# Patient Record
Sex: Female | Born: 1985 | Race: White | Hispanic: No | Marital: Married | Smoking: Never smoker
Health system: Southern US, Community
[De-identification: ages and names within clinical notes are randomized; demographics above are authoritative.]

## PROBLEM LIST (undated history)

## (undated) ENCOUNTER — Inpatient Hospital Stay (HOSPITAL_COMMUNITY): Payer: Self-pay

## (undated) DIAGNOSIS — J302 Other seasonal allergic rhinitis: Secondary | ICD-10-CM

## (undated) HISTORY — PX: OTHER SURGICAL HISTORY: SHX169

## (undated) HISTORY — PX: WISDOM TOOTH EXTRACTION: SHX21

---
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
## 2009-01-23 ENCOUNTER — Inpatient Hospital Stay (HOSPITAL_COMMUNITY): Admission: RE | Admit: 2009-01-23 | Discharge: 2009-01-25 | Payer: Self-pay | Admitting: Obstetrics & Gynecology

## 2010-03-20 LAB — CBC
Hemoglobin: 11.4 g/dL — ABNORMAL LOW (ref 12.0–15.0)
MCHC: 33.4 g/dL (ref 30.0–36.0)
MCV: 84.4 fL (ref 78.0–100.0)
Platelets: 218 10*3/uL (ref 150–400)
Platelets: 226 10*3/uL (ref 150–400)
RBC: 3.74 MIL/uL — ABNORMAL LOW (ref 3.87–5.11)
RDW: 12.6 % (ref 11.5–15.5)
WBC: 20.1 10*3/uL — ABNORMAL HIGH (ref 4.0–10.5)

## 2010-03-20 LAB — RPR: RPR Ser Ql: NONREACTIVE

## 2013-01-01 NOTE — L&D Delivery Note (Signed)
Delivery Note At 5:42 PM a viable and healthy female was delivered via Vaginal, Spontaneous Delivery (Presentation: ; Occiput Anterior).  APGAR:8, 9; weight pending .   Placenta status: Intact, Manual.  Cord: 3 vessels with the following complications: None.  Cord pH: na  Anesthesia: Epidural  Episiotomy: None Lacerations: 1st degree;Perineal Suture Repair: 3.0 vicryl rapide Est. Blood Loss (mL):  300 Slight increase in bleeding Post delivery. No cervical lacerations. No extensions. Manual removal of retained placental fragment without complication. Bleeding improved.  Mom to postpartum.  Baby to Couplet care / Skin to Skin.  Nancy Sullivan J 11/03/2013, 5:56 PM

## 2013-03-27 LAB — OB RESULTS CONSOLE HIV ANTIBODY (ROUTINE TESTING): HIV: NONREACTIVE

## 2013-03-27 LAB — OB RESULTS CONSOLE ANTIBODY SCREEN: Antibody Screen: NEGATIVE

## 2013-03-27 LAB — OB RESULTS CONSOLE ABO/RH: RH TYPE: POSITIVE

## 2013-03-27 LAB — OB RESULTS CONSOLE HEPATITIS B SURFACE ANTIGEN: HEP B S AG: NEGATIVE

## 2013-03-27 LAB — OB RESULTS CONSOLE RUBELLA ANTIBODY, IGM: RUBELLA: IMMUNE

## 2013-03-27 LAB — OB RESULTS CONSOLE RPR: RPR: NONREACTIVE

## 2013-04-13 LAB — OB RESULTS CONSOLE GC/CHLAMYDIA
Chlamydia: NEGATIVE
Gonorrhea: NEGATIVE

## 2013-08-10 LAB — OB RESULTS CONSOLE RPR: RPR: NONREACTIVE

## 2013-10-06 LAB — OB RESULTS CONSOLE GBS: GBS: POSITIVE

## 2013-10-25 ENCOUNTER — Inpatient Hospital Stay (HOSPITAL_COMMUNITY)
Admission: AD | Admit: 2013-10-25 | Discharge: 2013-10-25 | Disposition: A | Discharge status: Home / Self Care | Payer: BC Managed Care – PPO | Source: Ambulatory Visit | Attending: Obstetrics | Admitting: Obstetrics

## 2013-10-25 ENCOUNTER — Encounter (HOSPITAL_COMMUNITY): Payer: Self-pay

## 2013-10-25 DIAGNOSIS — Z3A38 38 weeks gestation of pregnancy: Secondary | ICD-10-CM | POA: Insufficient documentation

## 2013-10-25 DIAGNOSIS — O471 False labor at or after 37 completed weeks of gestation: Secondary | ICD-10-CM | POA: Insufficient documentation

## 2013-10-25 NOTE — Discharge Instructions (Signed)

## 2013-10-25 NOTE — MAU Note (Signed)
Patient states she called the doctor's office and they said she should come in and be checked.

## 2013-11-02 ENCOUNTER — Encounter (HOSPITAL_COMMUNITY): Payer: Self-pay

## 2013-11-02 ENCOUNTER — Other Ambulatory Visit: Payer: Self-pay | Admitting: Obstetrics and Gynecology

## 2013-11-03 ENCOUNTER — Inpatient Hospital Stay (HOSPITAL_COMMUNITY): Payer: BC Managed Care – PPO | Admitting: Anesthesiology

## 2013-11-03 ENCOUNTER — Inpatient Hospital Stay (HOSPITAL_COMMUNITY)
Admission: RE | Admit: 2013-11-03 | Discharge: 2013-11-05 | DRG: 767 | Disposition: A | Discharge status: Home / Self Care | Payer: BC Managed Care – PPO | Source: Ambulatory Visit | Attending: Obstetrics and Gynecology | Admitting: Obstetrics and Gynecology

## 2013-11-03 ENCOUNTER — Encounter (HOSPITAL_COMMUNITY): Payer: Self-pay

## 2013-11-03 DIAGNOSIS — Z3A39 39 weeks gestation of pregnancy: Secondary | ICD-10-CM | POA: Diagnosis present

## 2013-11-03 DIAGNOSIS — O99824 Streptococcus B carrier state complicating childbirth: Principal | ICD-10-CM | POA: Diagnosis present

## 2013-11-03 DIAGNOSIS — O99820 Streptococcus B carrier state complicating pregnancy: Secondary | ICD-10-CM | POA: Diagnosis present

## 2013-11-03 LAB — CBC
HCT: 36.6 % (ref 36.0–46.0)
Hemoglobin: 12.4 g/dL (ref 12.0–15.0)
MCH: 28.6 pg (ref 26.0–34.0)
MCHC: 33.9 g/dL (ref 30.0–36.0)
MCV: 84.3 fL (ref 78.0–100.0)
PLATELETS: 176 10*3/uL (ref 150–400)
RBC: 4.34 MIL/uL (ref 3.87–5.11)
RDW: 14.6 % (ref 11.5–15.5)
WBC: 9.1 10*3/uL (ref 4.0–10.5)

## 2013-11-03 LAB — TYPE AND SCREEN
ABO/RH(D): B POS
Antibody Screen: NEGATIVE

## 2013-11-03 LAB — ABO/RH: ABO/RH(D): B POS

## 2013-11-03 LAB — RPR

## 2013-11-03 MED ORDER — OXYTOCIN 40 UNITS IN LACTATED RINGERS INFUSION - SIMPLE MED: 62.5000 mL/h | INTRAVENOUS | Status: DC

## 2013-11-03 MED ORDER — LACTATED RINGERS IV SOLN: 500.0000 mL | INTRAVENOUS | Status: DC | PRN

## 2013-11-03 MED ORDER — ONDANSETRON HCL 4 MG/2ML IJ SOLN
4.0000 mg | Freq: Four times a day (QID) | INTRAMUSCULAR | Status: DC | PRN
  Administered 2013-11-03: 4 mg via INTRAVENOUS
  Filled 2013-11-03: qty 2

## 2013-11-03 MED ORDER — OXYCODONE-ACETAMINOPHEN 5-325 MG PO TABS: 1.0000 | ORAL_TABLET | ORAL | Status: DC | PRN

## 2013-11-03 MED ORDER — DEXTROSE 5 % IV SOLN
5.0000 10*6.[IU] | Freq: Once | INTRAVENOUS | Status: AC
  Administered 2013-11-03: 5 10*6.[IU] via INTRAVENOUS
  Filled 2013-11-03: qty 5

## 2013-11-03 MED ORDER — EPHEDRINE 5 MG/ML INJ
10.0000 mg | INTRAVENOUS | Status: DC | PRN
  Filled 2013-11-03: qty 2

## 2013-11-03 MED ORDER — IBUPROFEN 600 MG PO TABS
600.0000 mg | ORAL_TABLET | Freq: Four times a day (QID) | ORAL | Status: DC
  Administered 2013-11-04 – 2013-11-05 (×4): 600 mg via ORAL
  Filled 2013-11-03 (×4): qty 1

## 2013-11-03 MED ORDER — PENICILLIN G POTASSIUM 5000000 UNITS IJ SOLR
2.5000 10*6.[IU] | INTRAVENOUS | Status: DC
  Administered 2013-11-03: 2.5 10*6.[IU] via INTRAVENOUS
  Filled 2013-11-03 (×5): qty 2.5

## 2013-11-03 MED ORDER — TETANUS-DIPHTH-ACELL PERTUSSIS 5-2.5-18.5 LF-MCG/0.5 IM SUSP
0.5000 mL | Freq: Once | INTRAMUSCULAR | Status: DC
Start: 2013-11-04 — End: 2013-11-05

## 2013-11-03 MED ORDER — WITCH HAZEL-GLYCERIN EX PADS: 1.0000 "application " | MEDICATED_PAD | CUTANEOUS | Status: DC | PRN

## 2013-11-03 MED ORDER — LACTATED RINGERS IV SOLN: 500.0000 mL | Freq: Once | INTRAVENOUS | Status: DC

## 2013-11-03 MED ORDER — DIBUCAINE 1 % RE OINT: 1.0000 "application " | TOPICAL_OINTMENT | RECTAL | Status: DC | PRN

## 2013-11-03 MED ORDER — ZOLPIDEM TARTRATE 5 MG PO TABS: 5.0000 mg | ORAL_TABLET | Freq: Every evening | ORAL | Status: DC | PRN

## 2013-11-03 MED ORDER — PRENATAL MULTIVITAMIN CH
1.0000 | ORAL_TABLET | Freq: Every day | ORAL | Status: DC
  Administered 2013-11-04: 1 via ORAL
  Filled 2013-11-03: qty 1

## 2013-11-03 MED ORDER — LANOLIN HYDROUS EX OINT: TOPICAL_OINTMENT | CUTANEOUS | Status: DC | PRN

## 2013-11-03 MED ORDER — OXYCODONE-ACETAMINOPHEN 5-325 MG PO TABS: 2.0000 | ORAL_TABLET | ORAL | Status: DC | PRN

## 2013-11-03 MED ORDER — ACETAMINOPHEN 325 MG PO TABS: 650.0000 mg | ORAL_TABLET | ORAL | Status: DC | PRN

## 2013-11-03 MED ORDER — FLEET ENEMA 7-19 GM/118ML RE ENEM: 1.0000 | ENEMA | RECTAL | Status: DC | PRN

## 2013-11-03 MED ORDER — ONDANSETRON HCL 4 MG PO TABS: 4.0000 mg | ORAL_TABLET | ORAL | Status: DC | PRN

## 2013-11-03 MED ORDER — DIPHENHYDRAMINE HCL 25 MG PO CAPS: 25.0000 mg | ORAL_CAPSULE | Freq: Four times a day (QID) | ORAL | Status: DC | PRN

## 2013-11-03 MED ORDER — BENZOCAINE-MENTHOL 20-0.5 % EX AERO
1.0000 "application " | INHALATION_SPRAY | CUTANEOUS | Status: DC | PRN
  Administered 2013-11-05: 1 via TOPICAL
  Filled 2013-11-03: qty 56

## 2013-11-03 MED ORDER — SIMETHICONE 80 MG PO CHEW: 80.0000 mg | CHEWABLE_TABLET | ORAL | Status: DC | PRN

## 2013-11-03 MED ORDER — PHENYLEPHRINE 40 MCG/ML (10ML) SYRINGE FOR IV PUSH (FOR BLOOD PRESSURE SUPPORT)
80.0000 ug | PREFILLED_SYRINGE | INTRAVENOUS | Status: DC | PRN
  Filled 2013-11-03: qty 10
  Filled 2013-11-03: qty 2

## 2013-11-03 MED ORDER — TERBUTALINE SULFATE 1 MG/ML IJ SOLN: 0.2500 mg | Freq: Once | INTRAMUSCULAR | Status: DC | PRN

## 2013-11-03 MED ORDER — ONDANSETRON HCL 4 MG/2ML IJ SOLN: 4.0000 mg | INTRAMUSCULAR | Status: DC | PRN

## 2013-11-03 MED ORDER — LIDOCAINE HCL (PF) 1 % IJ SOLN
30.0000 mL | INTRAMUSCULAR | Status: DC | PRN
  Filled 2013-11-03: qty 30

## 2013-11-03 MED ORDER — LIDOCAINE HCL (PF) 1 % IJ SOLN
INTRAMUSCULAR | Status: DC | PRN
  Administered 2013-11-03 (×4): 4 mL

## 2013-11-03 MED ORDER — OXYTOCIN 40 UNITS IN LACTATED RINGERS INFUSION - SIMPLE MED
1.0000 m[IU]/min | INTRAVENOUS | Status: DC
  Administered 2013-11-03: 2 m[IU]/min via INTRAVENOUS
  Filled 2013-11-03: qty 1000

## 2013-11-03 MED ORDER — SENNOSIDES-DOCUSATE SODIUM 8.6-50 MG PO TABS
2.0000 | ORAL_TABLET | ORAL | Status: DC
  Administered 2013-11-05: 2 via ORAL
  Filled 2013-11-03: qty 2

## 2013-11-03 MED ORDER — DIPHENHYDRAMINE HCL 50 MG/ML IJ SOLN: 12.5000 mg | INTRAMUSCULAR | Status: DC | PRN

## 2013-11-03 MED ORDER — CITRIC ACID-SODIUM CITRATE 334-500 MG/5ML PO SOLN: 30.0000 mL | ORAL | Status: DC | PRN

## 2013-11-03 MED ORDER — METHYLERGONOVINE MALEATE 0.2 MG PO TABS: 0.2000 mg | ORAL_TABLET | ORAL | Status: DC | PRN

## 2013-11-03 MED ORDER — LACTATED RINGERS IV SOLN
INTRAVENOUS | Status: DC
  Administered 2013-11-03 (×2): via INTRAVENOUS

## 2013-11-03 MED ORDER — OXYTOCIN BOLUS FROM INFUSION: 500.0000 mL | INTRAVENOUS | Status: DC

## 2013-11-03 MED ORDER — PHENYLEPHRINE 40 MCG/ML (10ML) SYRINGE FOR IV PUSH (FOR BLOOD PRESSURE SUPPORT)
80.0000 ug | PREFILLED_SYRINGE | INTRAVENOUS | Status: DC | PRN
  Filled 2013-11-03: qty 2

## 2013-11-03 MED ORDER — FENTANYL 2.5 MCG/ML BUPIVACAINE 1/10 % EPIDURAL INFUSION (WH - ANES)
14.0000 mL/h | INTRAMUSCULAR | Status: DC | PRN
  Administered 2013-11-03: 14 mL/h via EPIDURAL
  Filled 2013-11-03: qty 125

## 2013-11-03 MED ORDER — METHYLERGONOVINE MALEATE 0.2 MG/ML IJ SOLN: 0.2000 mg | INTRAMUSCULAR | Status: DC | PRN

## 2013-11-03 NOTE — Plan of Care (Signed)
Problem: Phase I Progression Outcomes Goal: Discharge home if all goals are met Outcome: Not Applicable Date Met:  28/97/91

## 2013-11-03 NOTE — H&P (Signed)
Nancy Sullivan is a 28 y.o. female presenting for elective IOL with favorable VE at 39+ weeks.  Maternal Medical History:  Reason for admission: Contractions.   Contractions: Onset was less than 1 hour ago.   Perceived severity is mild.    Fetal activity: Perceived fetal activity is normal.   Last perceived fetal movement was within the past hour.    Prenatal complications: no prenatal complications Prenatal Complications - Diabetes: none.    OB History    Gravida Para Term Preterm AB TAB SAB Ectopic Multiple Living   2 1 1       1      Past Medical History  Diagnosis Date  . Medical history non-contributory    Past Surgical History  Procedure Laterality Date  . No past surgeries     Family History: family history is not on file. Social History:  reports that she has never smoked. She has never used smokeless tobacco. She reports that she does not drink alcohol or use illicit drugs.   Prenatal Transfer Tool  Maternal Diabetes: No Genetic Screening: Normal Maternal Ultrasounds/Referrals: Normal Fetal Ultrasounds or other Referrals:  None Maternal Substance Abuse:  No Significant Maternal Medications:  None Significant Maternal Lab Results:  None Other Comments:  None  Review of Systems  All other systems reviewed and are negative.   Dilation: 5.5 Effacement (%): 90 Station: -1 Exam by:: Daviona Herbert Blood pressure 137/82, pulse 96, temperature 97 F (36.1 C), temperature source Oral, resp. rate 18, height 5' (1.524 m), weight 80.74 kg (178 lb), last menstrual period 01/15/2013, SpO2 100 %. Maternal Exam:  Uterine Assessment: Contraction strength is mild.  Contraction frequency is irregular.   Abdomen: Patient reports no abdominal tenderness. Fetal presentation: vertex  Introitus: Normal vulva. Normal vagina.  Pelvis: adequate for delivery.   Cervix: Cervix evaluated by digital exam.     Physical Exam  Constitutional: She is oriented to person, place, and time.  She appears well-developed and well-nourished.  HENT:  Head: Normocephalic.  Cardiovascular: Normal rate, regular rhythm and normal heart sounds.   Respiratory: Effort normal and breath sounds normal.  GI: Soft. Bowel sounds are normal.  Genitourinary: Vagina normal and uterus normal.  Musculoskeletal: Normal range of motion.  Neurological: She is alert and oriented to person, place, and time.  Skin: Skin is warm and dry.  Psychiatric: She has a normal mood and affect. Her behavior is normal.    Prenatal labs: ABO, Rh: --/--/B POS (11/03 0800) Antibody: NEG (11/03 0800) Rubella: Immune (03/27 0000) RPR: Nonreactive (08/10 0000)  HBsAg: Negative (03/27 0000)  HIV: Non-reactive (03/27 0000)  GBS: Positive (10/06 0000)   Assessment/Plan: IOL at 39 weeks with favorable cervix Pitocin , Epidural prn   Guillermo Difrancesco J 11/03/2013, 1:55 PM

## 2013-11-03 NOTE — Plan of Care (Signed)
Problem: Phase II Progression Outcomes Goal: Frequent position change(s) Outcome: Completed/Met Date Met:  11/03/13     

## 2013-11-03 NOTE — Plan of Care (Signed)
Problem: Phase I Progression Outcomes Goal: OOB as tolerated unless otherwise ordered Outcome: Completed/Met Date Met:  11/03/13     

## 2013-11-03 NOTE — Plan of Care (Signed)
Problem: Consults Goal: Postpartum Patient Education (See Patient Education module for education specifics.) Outcome: Completed/Met Date Met:  11/03/13  Problem: Phase I Progression Outcomes Goal: Foley catheter patent Outcome: Not Applicable Date Met:  58/09/98

## 2013-11-03 NOTE — Plan of Care (Signed)
Problem: Phase I Progression Outcomes Goal: IV Pain medications as ordered Outcome: Not Applicable Date Met:  11/03/13     

## 2013-11-03 NOTE — Plan of Care (Signed)
Problem: Phase II Progression Outcomes Goal: Other Phase II Outcomes/Goals Outcome: Not Applicable Date Met:  11/03/13     

## 2013-11-03 NOTE — Plan of Care (Signed)
Problem: Phase I Progression Outcomes Goal: Pain controlled with appropriate interventions Outcome: Completed/Met Date Met:  11/03/13     

## 2013-11-03 NOTE — Plan of Care (Signed)
Problem: Phase II Progression Outcomes Goal: Fetal monitoring per orders Outcome: Completed/Met Date Met:  11/03/13

## 2013-11-03 NOTE — Plan of Care (Signed)
Problem: Phase I Progression Outcomes Goal: Medications/IV Fluids N/A Outcome: Completed/Met Date Met:  11/03/13

## 2013-11-03 NOTE — Plan of Care (Signed)
Problem: Phase I Progression Outcomes Goal: Medical plan of care initiated within 2 hrs of admission Outcome: Completed/Met Date Met:  11/03/13

## 2013-11-03 NOTE — Plan of Care (Signed)
Problem: Phase I Progression Outcomes Goal: Induction meds as ordered Outcome: Not Applicable Date Met:  88/93/38

## 2013-11-03 NOTE — Plan of Care (Signed)
Problem: Phase I Progression Outcomes Goal: Assess per MD/Nurse,Routine-VS,FHR,UC,Head to Toe assess Outcome: Completed/Met Date Met:  11/03/13

## 2013-11-03 NOTE — Anesthesia Preprocedure Evaluation (Signed)
Anesthesia Evaluation  Patient identified by MRN, date of birth, ID band Patient awake    Reviewed: Allergy & Precautions, H&P , NPO status , Patient's Chart, lab work & pertinent test results, reviewed documented beta blocker date and time   History of Anesthesia Complications Negative for: history of anesthetic complications  Airway Mallampati: III  TM Distance: >3 FB Neck ROM: full    Dental  (+) Teeth Intact   Pulmonary neg pulmonary ROS,  breath sounds clear to auscultation        Cardiovascular negative cardio ROS  Rhythm:regular Rate:Normal     Neuro/Psych negative neurological ROS  negative psych ROS   GI/Hepatic negative GI ROS, Neg liver ROS,   Endo/Other  BMI 35  Renal/GU negative Renal ROS  negative genitourinary   Musculoskeletal   Abdominal   Peds  Hematology negative hematology ROS (+)   Anesthesia Other Findings   Reproductive/Obstetrics (+) Pregnancy                             Anesthesia Physical Anesthesia Plan  ASA: II  Anesthesia Plan: Epidural   Post-op Pain Management:    Induction:   Airway Management Planned:   Additional Equipment:   Intra-op Plan:   Post-operative Plan:   Informed Consent: I have reviewed the patients History and Physical, chart, labs and discussed the procedure including the risks, benefits and alternatives for the proposed anesthesia with the patient or authorized representative who has indicated his/her understanding and acceptance.     Plan Discussed with:   Anesthesia Plan Comments:         Anesthesia Quick Evaluation

## 2013-11-03 NOTE — Plan of Care (Signed)
Problem: Phase II Progression Outcomes Goal: Clear liquid diet Outcome: Completed/Met Date Met:  11/03/13

## 2013-11-03 NOTE — Plan of Care (Signed)
Problem: Phase I Progression Outcomes Goal: FHR checked 5 minutes after meds (ROM) Rupture of Membranes Outcome: Completed/Met Date Met:  11/03/13     

## 2013-11-03 NOTE — Plan of Care (Signed)
Problem: Phase I Progression Outcomes Goal: Tolerating diet Outcome: Completed/Met Date Met:  11/03/13

## 2013-11-03 NOTE — Plan of Care (Signed)
Problem: Phase II Progression Outcomes Goal: Empty bladder prn Outcome: Completed/Met Date Met:  11/03/13

## 2013-11-03 NOTE — Plan of Care (Signed)
Problem: Phase II Progression Outcomes Goal: Regular diet Outcome: Not Applicable Date Met:  93/26/71

## 2013-11-03 NOTE — Progress Notes (Signed)
Nancy Sullivan is a 28 y.o. G2P1001 at [redacted]w[redacted]d by LMP admitted for induction of labor due to Elective at term.  Subjective: comfortable  Objective: BP 137/82 mmHg  Pulse 96  Temp(Src) 97 F (36.1 C) (Oral)  Resp 18  Ht 5' (1.524 m)  Wt 80.74 kg (178 lb)  BMI 34.76 kg/m2  SpO2 100%  LMP 01/15/2013      FHT:  FHR: 155 bpm, variability: moderate,  accelerations:  Present,  decelerations:  Absent and   UC:   regular, every 3 minutes SVE:   Dilation: 5.5 Effacement (%): 90 Station: -1 Exam by:: Amelita Risinger  Labs: Lab Results  Component Value Date   WBC 9.1 11/03/2013   HGB 12.4 11/03/2013   HCT 36.6 11/03/2013   MCV 84.3 11/03/2013   PLT 176 11/03/2013    Assessment / Plan: Induction of labor due to term with favorable cervix,  progressing well on pitocin  Labor: Progressing normally Preeclampsia:  no signs or symptoms of toxicity Fetal Wellbeing:  Category I Pain Control:  Epidural I/D:  n/a Anticipated MOD:  NSVD  Nancy Sullivan J 11/03/2013, 1:58 PM

## 2013-11-03 NOTE — Plan of Care (Signed)
Problem: Phase I Progression Outcomes Goal: Pitocin as ordered Outcome: Completed/Met Date Met:  11/03/13     

## 2013-11-03 NOTE — Anesthesia Procedure Notes (Addendum)
Epidural Patient location during procedure: OB  Staffing Anesthesiologist: Beatric Fulop Performed by: anesthesiologist   Preanesthetic Checklist Completed: patient identified, site marked, surgical consent, pre-op evaluation, timeout performed, IV checked, risks and benefits discussed and monitors and equipment checked  Epidural Patient position: sitting Prep: site prepped and draped and DuraPrep Patient monitoring: continuous pulse ox and blood pressure Approach: midline Location: L3-L4 Injection technique: LOR air  Needle:  Needle type: Tuohy  Needle gauge: 17 G Needle length: 9 cm and 9 Needle insertion depth: 6 cm Catheter type: closed end flexible Catheter size: 19 Gauge Catheter at skin depth: 11 cm Test dose: negative  Assessment Events: blood not aspirated, injection not painful, no injection resistance, negative IV test and no paresthesia  Additional Notes Discussed risk of headache, infection, bleeding, nerve injury and failed or incomplete block.  Patient voices understanding and wishes to proceed.  Epidural placed easily on first attempt.  No paresthesia.  Patient tolerated procedure well with no apparent complications.  Charlton Haws, MDReason for block:procedure for pain

## 2013-11-03 NOTE — Plan of Care (Signed)
Problem: Phase I Progression Outcomes Goal: Obtain and review prenatal records Outcome: Completed/Met Date Met:  11/03/13

## 2013-11-03 NOTE — Plan of Care (Signed)
Problem: Phase I Progression Outcomes Goal: Other Phase I Outcomes/Goals Outcome: Not Applicable Date Met:  11/03/13     

## 2013-11-03 NOTE — Plan of Care (Signed)
Problem: Phase II Progression Outcomes Goal: Activity as indicated Outcome: Completed/Met Date Met:  11/03/13     

## 2013-11-04 LAB — CBC
HEMATOCRIT: 36.4 % (ref 36.0–46.0)
HEMOGLOBIN: 12.2 g/dL (ref 12.0–15.0)
MCH: 28.4 pg (ref 26.0–34.0)
MCHC: 33.5 g/dL (ref 30.0–36.0)
MCV: 84.8 fL (ref 78.0–100.0)
Platelets: 142 10*3/uL — ABNORMAL LOW (ref 150–400)
RBC: 4.29 MIL/uL (ref 3.87–5.11)
RDW: 14.5 % (ref 11.5–15.5)
WBC: 13.4 10*3/uL — ABNORMAL HIGH (ref 4.0–10.5)

## 2013-11-04 MED ORDER — INFLUENZA VAC SPLIT QUAD 0.5 ML IM SUSY
0.5000 mL | PREFILLED_SYRINGE | INTRAMUSCULAR | Status: DC
Start: 2013-11-05 — End: 2013-11-04

## 2013-11-04 NOTE — Progress Notes (Signed)
PPD #1- SVD  Subjective:   Reports feeling well Tolerating po/ No nausea or vomiting Bleeding is light Pain controlled with Motrin Up ad lib / ambulatory / voiding without problems Newborn: breastfeeding     Objective:   VS:  VS:  Filed Vitals:   11/03/13 1904 11/03/13 2038 11/03/13 2129 11/04/13 0227  BP: 129/67 127/64 132/73 116/64  Pulse: 78 87 92   Temp:    98.3 F (36.8 C)  TempSrc:    Oral  Resp:  18 18 18   Height:      Weight:      SpO2:  100% 100% 99%    LABS:  Recent Labs  11/03/13 0800 11/04/13 0612  WBC 9.1 13.4*  HGB 12.4 12.2  PLT 176 142*   Blood type: --/--/B POS, B POS (11/03 0800) Rubella: Immune (03/27 0000)   I&O: Intake/Output      11/03 0701 - 11/04 0700 11/04 0701 - 11/05 0700   Urine (mL/kg/hr) 701    Blood 300    Total Output 1001     Net -1001            Physical Exam: Alert and oriented x3 Abdomen: soft, non-tender, non-distended  Fundus: firm, non-tender, U-2 Perineum: Well approximated, no significant erythema, edema, or drainage; healing well. Lochia: small Extremities: trace BLE edema, no calf pain or tenderness    Assessment:  PPD #1 G2P2002/ S/P:induced vaginal, 1st degree laceration  Doing well    Plan: Continue routine post partum orders Anticipate D/C home tomorrow   Julianne Handler, N MSN, CNM 11/04/2013, 9:28 AM

## 2013-11-04 NOTE — Anesthesia Postprocedure Evaluation (Signed)
  Anesthesia Post-op Note  Anesthesia Post Note  Patient: Nancy Sullivan  Procedure(s) Performed: * No procedures listed *  Anesthesia type: Epidural  Patient location: Mother/Baby  Post pain: Pain level controlled  Post assessment: Post-op Vital signs reviewed  Last Vitals:  Filed Vitals:   11/04/13 0227  BP: 116/64  Pulse:   Temp: 36.8 C  Resp: 18    Post vital signs: Reviewed  Level of consciousness:alert  Complications: No apparent anesthesia complications

## 2013-11-05 MED ORDER — IBUPROFEN 600 MG PO TABS: 600.0000 mg | ORAL_TABLET | Freq: Four times a day (QID) | ORAL | Status: AC

## 2013-11-05 NOTE — Lactation Note (Signed)
This note was copied from the chart of Nancy Sullivan. Lactation Consultation Note; Follow up visit with mom before DC.The are putting baby in car seat at present. Mom reports that baby is latching well and the comfort gels are helping. Reviewed Op appointments as resource if nipples are not getting better . No questions at present. To call prn  Patient Name: Nancy Sullivan [id number] Date: 11/05/2013 Reason for consult: Follow-up assessment   Maternal Data    Feeding Feeding Type: Breast Fed Length of feed: 5 min  LATCH Score/Interventions                      Lactation Tools Discussed/Used     Consult Status Consult Status: Complete    Truddie Crumble 11/05/2013, 11:35 AM

## 2013-11-05 NOTE — Discharge Instructions (Signed)
Breast Pumping Tips °If you are breastfeeding, there may be times when you cannot feed your baby directly. Returning to work or going on a trip are common examples. Pumping allows you to store breast milk and feed it to your baby later.  °You may not get much milk when you first start to pump. Your breasts should start to make more after a few days. If you pump at the times you usually feed your baby, you may be able to keep making enough milk to feed your baby without also using formula. The more often you pump, the more milk you will produce.  °WHEN SHOULD I PUMP?  °· You can begin to pump soon after delivery. However, some experts recommend waiting about 4 weeks before giving your infant a bottle to make sure breastfeeding is going well.  °· If you plan to return to work, begin pumping a few weeks before. This will help you develop techniques that work best for you. It also lets you build up a supply of breast milk.   °· When you are with your infant, feed on demand and pump after each feeding.   °· When you are away from your infant for several hours, pump for about 15 minutes every 2-3 hours. Pump both breasts at the same time if you can.   °· If your infant has a formula feeding, make sure to pump around the same time.     °· If you drink any alcohol, wait 2 hours before pumping.   °HOW DO I PREPARE TO PUMP? °Your let-down reflex is the natural reaction to stimulation that makes your breast milk flow. It is easier to stimulate this reflex when you are relaxed. Find relaxation techniques that work for you. If you have difficulty with your let-down reflex, try these methods:  °· Smell one of your infant's blankets or an item of clothing.   °· Look at a picture or video of your infant.   °· Sit in a quiet, private space.   °· Massage the breast you plan to pump.   °· Place soothing warmth on the breast.   °· Play relaxing music.   °WHAT ARE SOME GENERAL BREAST PUMPING TIPS? °· Wash your hands before you pump. You  do not need to wash your nipples or breasts. °· There are three ways to pump. °¨ You can use your hand to massage and compress your breast. °¨ You can use a handheld manual pump. °¨ You can use an electric pump.   °· Make sure the suction cup (flange) on the breast pump is the right size. Place the flange directly over the nipple. If it is the wrong size or placed the wrong way, it may be painful and cause nipple damage.   °· If pumping is uncomfortable, apply a small amount of purified or modified lanolin to your nipple and areola. °· If you are using an electric pump, adjust the speed and suction power to be more comfortable. °· If pumping is painful or if you are not getting very much milk, you may need a different type of pump. A lactation consultant can help you determine what type of pump to use.   °· Keep a full water bottle near you at all times. Drinking lots of fluid helps you make more milk.  °· You can store your milk to use later. Pumped breast milk can be stored in a sealable, sterile container or plastic bag. Label all stored breast milk with the date you pumped it. °¨ Milk can stay out at room temperature for up to 8 hours. °¨   You can store your milk in the refrigerator for up to 8 days. °¨ You can store your milk in the freezer for 3 months. Thaw frozen milk using warm water. Do not put it in the microwave. °· Do not smoke. Smoking can lower your milk supply and harm your infant. If you need help quitting, ask your health care provider to recommend a program.   °WHEN SHOULD I CALL MY HEALTH CARE PROVIDER OR A LACTATION CONSULTANT? °· You are having trouble pumping. °· You are concerned that you are not making enough milk. °· You have nipple pain, soreness, or redness. °· You want to use birth control. Birth control pills may lower your milk supply. Talk to your health care provider about your options. °Document Released: 06/07/2009 Document Revised: 12/23/2012 Document Reviewed:  10/10/2012 °Exit[employer]® Patient Information ©2015 [employer]. This information is not intended to replace advice given to you by your health care provider. Make sure you discuss any questions you have with your health care provider. ° °Nutrition for the New Mother  °A new mother needs good health and nutrition so she can have energy to take care of a new baby. Whether a mother breastfeeds or formula feeds the baby, it is important to have a well-balanced diet. Foods from all the food groups should be chosen to meet the new mother's energy needs and to give her the nutrients needed for repair and healing.  °A HEALTHY EATING PLAN °The My [employer] plan for Moms outlines what you should eat to help you and your baby stay healthy. The energy and amount of food you need depends on whether or not you are breastfeeding. If you are breastfeeding you will need more nutrients. If you choose not to breastfeed, your nutrition goal should be to return to a healthy weight. Limiting calories may be needed if you are not breastfeeding.  °HOME CARE INSTRUCTIONS  °· For a personal plan based on your unique needs, see your Registered Dietitian or visit www.mypyramid.gov. °· Eat a variety of foods. The plan below will help guide you. The following chart has a suggested daily meal plan from the [employer] for Moms. °· Eat a variety of fruits and vegetables. °· Eat more dark green and orange vegetables and cooked dried beans. °· Make half your grains whole grains. Choose whole instead of refined grains. °· Choose low-fat or lean meats and poultry. °· Choose low-fat or fat-free dairy products like milk, cheese, or yogurt. °Fruits °· Breastfeeding: 2 cups °· Non-Breastfeeding: 2 cups °· What Counts as a serving? °¨ 1 cup of fruit or juice. °¨ ½ cup dried fruit. °Vegetables °· Breastfeeding: 3 cups °· Non-Breastfeeding: 2 ½ cups °· What Counts as a serving? °¨ 1 cup raw or cooked vegetables. °¨ Juice or 2 cups raw leafy  vegetables. °Grains °· Breastfeeding: 8 oz °· Non-Breastfeeding: 6 oz °· What Counts as a serving? °¨ 1 slice bread. °¨ 1 oz ready-to-eat cereal. °¨ ½ cup cooked pasta, rice, or cereal. °Meat and Beans °· Breastfeeding: 6 ½ oz °· Non-Breastfeeding: 5 ½ oz °· What Counts as a serving? °¨ 1 oz lean meat, poultry, or fish °¨ ¼ cup cooked dry beans °¨ ½ oz nuts or 1 egg °¨ 1 tbs peanut butter °Milk °· Breastfeeding: 3 cups °· Non-Breastfeeding: 3 cups °· What Counts as a serving? °¨ 1 cup milk. °¨ 8 oz yogurt. °¨ 1 ½ oz cheese. °¨ 2 oz processed cheese. °TIPS FOR THE BREASTFEEDING MOM °· Rapid weight   loss is not suggested when you are breastfeeding. By simply breastfeeding, you will be able to lose the weight gained during your pregnancy. Your caregiver can keep track of your weight and tell you if your weight loss is appropriate. °· Be sure to drink fluids. You may notice that you are thirstier than usual. A suggestion is to drink a glass of water or other beverage whenever you breastfeed. °· Avoid alcohol as it can be passed into your breast milk. °· Limit caffeine drinks to no more than 2 to 3 cups per day. °· You may need to keep taking your prenatal vitamin while you are breastfeeding. Talk with your caregiver about taking a vitamin or supplement. °RETURING TO A HEALTHY WEIGHT °· The My [employer] Plan for Moms will help you return to a healthy weight. It will also provide the nutrients you need. °· You may need to limit "empty" calories. These include: °¨ High fat foods like fried foods, fatty meats, fast food, butter, and mayonnaise. °¨ High sugar foods like sodas, jelly, candy, and sweets. °· Be physically active. Include 30 minutes of exercise or more each day. Choose an activity you like such as walking, swimming, biking, or aerobics. Check with your caregiver before you start to exercise. °Document Released: 03/27/2007 Document Revised: 03/12/2011 Document Reviewed: 03/27/2007 °ExitCare® Patient Information  ©2015 [employer]. This information is not intended to replace advice given to you by your health care provider. Make sure you discuss any questions you have with your health care provider. °Postpartum Depression and Baby Blues °The postpartum period begins right after the birth of a baby. During this time, there is often a great amount of joy and excitement. It is also a time of many changes in the life of the parents. Regardless of how many times a mother gives birth, each child brings new challenges and dynamics to the family. It is not unusual to have feelings of excitement along with confusing shifts in moods, emotions, and thoughts. All mothers are at risk of developing postpartum depression or the "baby blues." These mood changes can occur right after giving birth, or they may occur many months after giving birth. The baby blues or postpartum depression can be mild or severe. Additionally, postpartum depression can go away rather quickly, or it can be a long-term condition.  °CAUSES °Raised hormone levels and the rapid drop in those levels are thought to be a main cause of postpartum depression and the baby blues. A number of hormones change during and after pregnancy. Estrogen and progesterone usually decrease right after the delivery of your baby. The levels of thyroid hormone and various cortisol steroids also rapidly drop. Other factors that play a role in these mood changes include major life events and genetics.  °RISK FACTORS °If you have any of the following risks for the baby blues or postpartum depression, know what symptoms to watch out for during the postpartum period. Risk factors that may increase the likelihood of getting the baby blues or postpartum depression include: °· Having a personal or family history of depression.   °· Having depression while being pregnant.   °· Having premenstrual mood issues or mood issues related to oral contraceptives. °· Having a lot of life stress.   °· Having  marital conflict.   °· Lacking a social support network.   °· Having a baby with special needs.   °· Having health problems, such as diabetes.   °SIGNS AND SYMPTOMS °Symptoms of baby blues include: °· Brief changes in mood, such as going   from extreme happiness to sadness. °· Decreased concentration.   °· Difficulty sleeping.   °· Crying spells, tearfulness.   °· Irritability.   °· Anxiety.   °Symptoms of postpartum depression typically begin within the first month after giving birth. These symptoms include: °· Difficulty sleeping or excessive sleepiness.   °· Marked weight loss.   °· Agitation.   °· Feelings of worthlessness.   °· Lack of interest in activity or food.   °Postpartum psychosis is a very serious condition and can be dangerous. Fortunately, it is rare. Displaying any of the following symptoms is cause for immediate medical attention. Symptoms of postpartum psychosis include:  °· Hallucinations and delusions.   °· Bizarre or disorganized behavior.   °· Confusion or disorientation.   °DIAGNOSIS  °A diagnosis is made by an evaluation of your symptoms. There are no medical or lab tests that lead to a diagnosis, but there are various questionnaires that a health care provider may use to identify those with the baby blues, postpartum depression, or psychosis. Often, a screening tool called the Edinburgh Postnatal Depression Scale is used to diagnose depression in the postpartum period.  °TREATMENT °The baby blues usually goes away on its own in 1-2 weeks. Social support is often all that is needed. You will be encouraged to get adequate sleep and rest. Occasionally, you may be given medicines to help you sleep.  °Postpartum depression requires treatment because it can last several months or longer if it is not treated. Treatment may include individual or group therapy, medicine, or both to address any social, physiological, and psychological factors that may play a role in the depression. Regular exercise, a  healthy diet, rest, and social support may also be strongly recommended.  °Postpartum psychosis is more serious and needs treatment right away. Hospitalization is often needed. °HOME CARE INSTRUCTIONS °· Get as much rest as you can. Nap when the baby sleeps.   °· Exercise regularly. Some women find yoga and walking to be beneficial.   °· Eat a balanced and nourishing diet.   °· Do little things that you enjoy. Have a cup of tea, take a bubble bath, read your favorite magazine, or listen to your favorite music. °· Avoid alcohol.   °· Ask for help with household chores, cooking, grocery shopping, or running errands as needed. Do not try to do everything.   °· Talk to people close to you about how you are feeling. Get support from your partner, family members, friends, or other new moms. °· Try to stay positive in how you think. Think about the things you are grateful for.   °· Do not spend a lot of time alone.   °· Only take over-the-counter or prescription medicine as directed by your health care provider. °· Keep all your postpartum appointments.   °· Let your health care provider know if you have any concerns.   °SEEK MEDICAL CARE IF: °You are having a reaction to or problems with your medicine. °SEEK IMMEDIATE MEDICAL CARE IF: °· You have suicidal feelings.   °· You think you may harm the baby or someone else. °MAKE SURE YOU: °· Understand these instructions. °· Will watch your condition. °· Will get help right away if you are not doing well or get worse. °Document Released: 09/22/2003 Document Revised: 12/23/2012 Document Reviewed: 09/29/2012 °Exit[employer]® Patient Information ©2015 [employer]. This information is not intended to replace advice given to you by your health care provider. Make sure you discuss any questions you have with your health care provider. °Breastfeeding and Mastitis °Mastitis is inflammation of the breast tissue. It can occur in women who   are breastfeeding. This can make breastfeeding  painful. Mastitis will sometimes go away on its own. Your health care provider will help determine if treatment is needed. °CAUSES °Mastitis is often associated with a blocked milk (lactiferous) duct. This can happen when too much milk builds up in the breast. Causes of excess milk in the breast can include: °· Poor latch-on. If your baby is not latched onto the breast properly, she or he may not empty your breast completely while breastfeeding. °· Allowing too much time to pass between feedings. °· Wearing a bra or other clothing that is too tight. This puts extra pressure on the lactiferous ducts so milk does not flow through them as it should. °Mastitis can also be caused by a bacterial infection. Bacteria may enter the breast tissue through cuts or openings in the skin. In women who are breastfeeding, this may occur because of cracked or irritated skin. Cracks in the skin are often caused when your baby does not latch on properly to the breast. °SIGNS AND SYMPTOMS °· Swelling, redness, tenderness, and pain in an area of the breast. °· Swelling of the glands under the arm on the same side. °· Fever may or may not accompany mastitis. °If an infection is allowed to progress, a collection of pus (abscess) may develop. °DIAGNOSIS  °Your health care provider can usually diagnose mastitis based on your symptoms and a physical exam. Tests may be done to help confirm the diagnosis. These may include: °· Removal of pus from the breast by applying pressure to the area. This pus can be examined in the lab to determine which bacteria are present. If an abscess has developed, the fluid in the abscess can be removed with a needle. This can also be used to confirm the diagnosis and determine the bacteria present. In most cases, pus will not be present. °· Blood tests to determine if your body is fighting a bacterial infection. °· Mammogram or ultrasound tests to rule out other problems or diseases. °TREATMENT  °Mastitis that  occurs with breastfeeding will sometimes go away on its own. Your health care provider may choose to wait 24 hours after first seeing you to decide whether a prescription medicine is needed. If your symptoms are worse after 24 hours, your health care provider will likely prescribe an antibiotic medicine to treat the mastitis. He or she will determine which bacteria are most likely causing the infection and will then select an appropriate antibiotic medicine. This is sometimes changed based on the results of tests performed to identify the bacteria, or if there is no response to the antibiotic medicine selected. Antibiotic medicines are usually given by mouth. You may also be given medicine for pain. °HOME CARE INSTRUCTIONS °· Only take over-the-counter or prescription medicines for pain, fever, or discomfort as directed by your health care provider. °· If your health care provider prescribed an antibiotic medicine, take the medicine as directed. Make sure you finish it even if you start to feel better. °· Do not wear a tight or underwire bra. Wear a soft, supportive bra. °· Increase your fluid intake, especially if you have a fever. °· Continue to empty the breast. Your health care provider can tell you whether this milk is safe for your infant or needs to be thrown out. You may be told to stop nursing until your health care provider thinks it is safe for your baby. Use a breast pump if you are advised to stop nursing. °· Keep your nipples   clean and dry. °· Empty the first breast completely before going to the other breast. If your baby is not emptying your breasts completely for some reason, use a breast pump to empty your breasts. °· If you go back to work, pump your breasts while at work to stay in time with your nursing schedule. °· Avoid allowing your breasts to become overly filled with milk (engorged). °SEEK MEDICAL CARE IF: °· You have pus-like discharge from the breast. °· Your symptoms do not improve with  the treatment prescribed by your health care provider within 2 days. °SEEK IMMEDIATE MEDICAL CARE IF: °· Your pain and swelling are getting worse. °· You have pain that is not controlled with medicine. °· You have a red line extending from the breast toward your armpit. °· You have a fever or persistent symptoms for more than 2-3 days. °· You have a fever and your symptoms suddenly get worse. °MAKE SURE YOU:  °· Understand these instructions. °· Will watch your condition. °· Will get help right away if you are not doing well or get worse. °Document Released: 04/14/2004 Document Revised: 12/23/2012 Document Reviewed: 07/24/2012 °[employer]® Patient Information ©2015 [employer]. This information is not intended to replace advice given to you by your health care provider. Make sure you discuss any questions you have with your health care provider. °Breastfeeding °Deciding to breastfeed is one of the best choices you can make for you and your baby. A change in hormones during pregnancy causes your breast tissue to grow and increases the number and size of your milk ducts. These hormones also allow proteins, sugars, and fats from your blood supply to make breast milk in your milk-producing glands. Hormones prevent breast milk from being released before your baby is born as well as prompt milk flow after birth. Once breastfeeding has begun, thoughts of your baby, as well as his or her sucking or crying, can stimulate the release of milk from your milk-producing glands.  °BENEFITS OF BREASTFEEDING °For Your Baby °· Your first milk (colostrum) helps your baby's digestive system function better.   °· There are antibodies in your milk that help your baby fight off infections.   °· Your baby has a lower incidence of asthma, allergies, and sudden infant death syndrome.   °· The nutrients in breast milk are better for your baby than infant formulas and are designed uniquely for your baby's needs.   °· Breast milk improves your  baby's brain development.   °· Your baby is less likely to develop other conditions, such as childhood obesity, asthma, or type 2 diabetes mellitus.   °For You  °· Breastfeeding helps to create a very special bond between you and your baby.   °· Breastfeeding is convenient. Breast milk is always available at the correct temperature and costs nothing.   °· Breastfeeding helps to burn calories and helps you lose the weight gained during pregnancy.   °· Breastfeeding makes your uterus contract to its prepregnancy size faster and slows bleeding (lochia) after you give birth.   °· Breastfeeding helps to lower your risk of developing type 2 diabetes mellitus, osteoporosis, and breast or ovarian cancer later in life. °SIGNS THAT YOUR BABY IS HUNGRY °Early Signs of Hunger  °· Increased alertness or activity. °· Stretching. °· Movement of the head from side to side. °· Movement of the head and opening of the mouth when the corner of the mouth or cheek is stroked (rooting). °· Increased sucking sounds, smacking lips, cooing, sighing, or squeaking. °· Hand-to-mouth movements. °· Increased sucking of   fingers or hands. °Late Signs of Hunger °· Fussing. °· Intermittent crying. °Extreme Signs of Hunger °Signs of extreme hunger will require calming and consoling before your baby will be able to breastfeed successfully. Do not wait for the following signs of extreme hunger to occur before you initiate breastfeeding:   °· Restlessness. °· A loud, strong cry. °·  Screaming. °BREASTFEEDING BASICS °Breastfeeding Initiation °· Find a comfortable place to sit or lie down, with your neck and back well supported. °· Place a pillow or rolled up blanket under your baby to bring him or her to the level of your breast (if you are seated). Nursing pillows are specially designed to help support your arms and your baby while you breastfeed. °· Make sure that your baby's abdomen is facing your abdomen.   °· Gently massage your breast. With your  fingertips, massage from your chest wall toward your nipple in a circular motion. This encourages milk flow. You may need to continue this action during the feeding if your milk flows slowly. °· Support your breast with 4 fingers underneath and your thumb above your nipple. Make sure your fingers are well away from your nipple and your baby's mouth.   °· Stroke your baby's lips gently with your finger or nipple.   °· When your baby's mouth is open wide enough, quickly bring your baby to your breast, placing your entire nipple and as much of the colored area around your nipple (areola) as possible into your baby's mouth.   °¨ More areola should be visible above your baby's upper lip than below the lower lip.   °¨ Your baby's tongue should be between his or her lower gum and your breast.   °· Ensure that your baby's mouth is correctly positioned around your nipple (latched). Your baby's lips should create a seal on your breast and be turned out (everted). °· It is common for your baby to suck about 2-3 minutes in order to start the flow of breast milk. °Latching °Teaching your baby how to latch on to your breast properly is very important. An improper latch can cause nipple pain and decreased milk supply for you and poor weight gain in your baby. Also, if your baby is not latched onto your nipple properly, he or she may swallow some air during feeding. This can make your baby fussy. Burping your baby when you switch breasts during the feeding can help to get rid of the air. However, teaching your baby to latch on properly is still the best way to prevent fussiness from swallowing air while breastfeeding. °Signs that your baby has successfully latched on to your nipple:    °· Silent tugging or silent sucking, without causing you pain.   °· Swallowing heard between every 3-4 sucks.   °·  Muscle movement above and in front of his or her ears while sucking.   °Signs that your baby has not successfully latched on to  nipple:  °· Sucking sounds or smacking sounds from your baby while breastfeeding. °· Nipple pain. °If you think your baby has not latched on correctly, slip your finger into the corner of your baby's mouth to break the suction and place it between your baby's gums. Attempt breastfeeding initiation again. °Signs of Successful Breastfeeding °Signs from your baby:   °· A gradual decrease in the number of sucks or complete cessation of sucking.   °· Falling asleep.   °· Relaxation of his or her body.   °· Retention of a small amount of milk in his or her mouth.   °· Letting go   of your breast by himself or herself. °Signs from you: °· Breasts that have increased in firmness, weight, and size 1-3 hours after feeding.   °· Breasts that are softer immediately after breastfeeding. °· Increased milk volume, as well as a change in milk consistency and color by the fifth day of breastfeeding.   °· Nipples that are not sore, cracked, or bleeding. °Signs That Your Baby is Getting Enough Milk °· Wetting at least 3 diapers in a 24-hour period. The urine should be clear and pale yellow by age 5 days. °· At least 3 stools in a 24-hour period by age 5 days. The stool should be soft and yellow. °· At least 3 stools in a 24-hour period by age 7 days. The stool should be seedy and yellow. °· No loss of weight greater than 10% of birth weight during the first 3 days of age. °· Average weight gain of 4-7 ounces (113-198 g) per week after age 4 days. °· Consistent daily weight gain by age 5 days, without weight loss after the age of 2 weeks. °After a feeding, your baby may spit up a small amount. This is common. °BREASTFEEDING FREQUENCY AND DURATION °Frequent feeding will help you make more milk and can prevent sore nipples and breast engorgement. Breastfeed when you feel the need to reduce the fullness of your breasts or when your baby shows signs of hunger. This is called "breastfeeding on demand." Avoid introducing a pacifier to your  baby while you are working to establish breastfeeding (the first 4-6 weeks after your baby is born). After this time you may choose to use a pacifier. Research has shown that pacifier use during the first year of a baby's life decreases the risk of sudden infant death syndrome (SIDS). °Allow your baby to feed on each breast as long as he or she wants. Breastfeed until your baby is finished feeding. When your baby unlatches or falls asleep while feeding from the first breast, offer the second breast. Because newborns are often sleepy in the first few weeks of life, you may need to awaken your baby to get him or her to feed. °Breastfeeding times will vary from baby to baby. However, the following rules can serve as a guide to help you ensure that your baby is properly fed: °· Newborns (babies 4 weeks of age or younger) may breastfeed every 1-3 hours. °· Newborns should not go longer than 3 hours during the day or 5 hours during the night without breastfeeding. °· You should breastfeed your baby a minimum of 8 times in a 24-hour period until you begin to introduce solid foods to your baby at around 6 months of age. °BREAST MILK PUMPING °Pumping and storing breast milk allows you to ensure that your baby is exclusively fed your breast milk, even at times when you are unable to breastfeed. This is especially important if you are going back to work while you are still breastfeeding or when you are not able to be present during feedings. Your lactation consultant can give you guidelines on how long it is safe to store breast milk.  °A breast pump is a machine that allows you to pump milk from your breast into a sterile bottle. The pumped breast milk can then be stored in a refrigerator or freezer. Some breast pumps are operated by hand, while others use electricity. Ask your lactation consultant which type will work best for you. Breast pumps can be purchased, but some hospitals and breastfeeding support groups   lease  breast pumps on a monthly basis. A lactation consultant can teach you how to hand express breast milk, if you prefer not to use a pump.  °CARING FOR YOUR BREASTS WHILE YOU BREASTFEED °Nipples can become dry, cracked, and sore while breastfeeding. The following recommendations can help keep your breasts moisturized and healthy: °· Avoid using soap on your nipples.   °· Wear a supportive bra. Although not required, special nursing bras and tank tops are designed to allow access to your breasts for breastfeeding without taking off your entire bra or top. Avoid wearing underwire-style bras or extremely tight bras. °· Air dry your nipples for 3-4 minutes after each feeding.   °· Use only cotton bra pads to absorb leaked breast milk. Leaking of breast milk between feedings is normal.   °· Use lanolin on your nipples after breastfeeding. Lanolin helps to maintain your skin's normal moisture barrier. If you use pure lanolin, you do not need to wash it off before feeding your baby again. Pure lanolin is not toxic to your baby. You may also hand express a few drops of breast milk and gently massage that milk into your nipples and allow the milk to air dry. °In the first few weeks after giving birth, some women experience extremely full breasts (engorgement). Engorgement can make your breasts feel heavy, warm, and tender to the touch. Engorgement peaks within 3-5 days after you give birth. The following recommendations can help ease engorgement: °· Completely empty your breasts while breastfeeding or pumping. You may want to start by applying warm, moist heat (in the shower or with warm water-soaked hand towels) just before feeding or pumping. This increases circulation and helps the milk flow. If your baby does not completely empty your breasts while breastfeeding, pump any extra milk after he or she is finished. °· Wear a snug bra (nursing or regular) or tank top for 1-2 days to signal your body to slightly decrease milk  production. °· Apply ice packs to your breasts, unless this is too uncomfortable for you. °· Make sure that your baby is latched on and positioned properly while breastfeeding. °If engorgement persists after 48 hours of following these recommendations, contact your health care provider or a lactation consultant. °OVERALL HEALTH CARE RECOMMENDATIONS WHILE BREASTFEEDING °· Eat healthy foods. Alternate between meals and snacks, eating 3 of each per day. Because what you eat affects your breast milk, some of the foods may make your baby more irritable than usual. Avoid eating these foods if you are sure that they are negatively affecting your baby. °· Drink milk, fruit juice, and water to satisfy your thirst (about 10 glasses a day).   °· Rest often, relax, and continue to take your prenatal vitamins to prevent fatigue, stress, and anemia. °· Continue breast self-awareness checks. °· Avoid chewing and smoking tobacco. °· Avoid alcohol and drug use. °Some medicines that may be harmful to your baby can pass through breast milk. It is important to ask your health care provider before taking any medicine, including all over-the-counter and prescription medicine as well as vitamin and herbal supplements. °It is possible to become pregnant while breastfeeding. If birth control is desired, ask your health care provider about options that will be safe for your baby. °SEEK MEDICAL CARE IF:  °· You feel like you want to stop breastfeeding or have become frustrated with breastfeeding. °· You have painful breasts or nipples. °· Your nipples are cracked or bleeding. °· Your breasts are red, tender, or warm. °· You have   a swollen area on either breast. °· You have a fever or chills. °· You have nausea or vomiting. °· You have drainage other than breast milk from your nipples. °· Your breasts do not become full before feedings by the fifth day after you give birth. °· You feel sad and depressed. °· Your baby is too sleepy to eat  well. °· Your baby is having trouble sleeping.   °· Your baby is wetting less than 3 diapers in a 24-hour period. °· Your baby has less than 3 stools in a 24-hour period. °· Your baby's skin or the white part of his or her eyes becomes yellow.   °· Your baby is not gaining weight by 5 days of age. °SEEK IMMEDIATE MEDICAL CARE IF:  °· Your baby is overly tired (lethargic) and does not want to wake up and feed. °· Your baby develops an unexplained fever. °Document Released: 12/18/2004 Document Revised: 12/23/2012 Document Reviewed: 06/11/2012 °Exit[employer]® Patient Information ©2015 [employer]. This information is not intended to replace advice given to you by your health care provider. Make sure you discuss any questions you have with your health care provider. ° °

## 2013-11-05 NOTE — Lactation Note (Signed)
This note was copied from the chart of Nancy Sullivan. Lactation Consultation Note Mom BF well. State getting sore. Has small nipples, very compressible. Positional stripes, comfort gels given. Mom states baby BF well. No concerns. Encouraged deep latch to prevent soreness. Mom encouraged to feed baby 8-12 times/24 hours and with feeding cues. Mom encouraged to waken baby for feeds. Encouraged to call for assistance if needed and to verify proper latch. Educated about newborn behavior. Referred to Baby and Me Book in Breastfeeding section Pg. 22-23 for position options and Proper latch demonstration.[facility] brochure given w/resources, support groups and [facility] services. Patient Name: Nancy Sullivan [id number] Date: 11/05/2013 Reason for consult: Initial assessment   Maternal Data Has patient been taught Hand Expression?: Yes Does the patient have breastfeeding experience prior to this delivery?: Yes  Feeding    LATCH Score/Interventions                      Lactation Tools Discussed/Used Initiated by:: rn Date initiated:: 11/04/13   Consult Status Consult Status: Follow-up Date: 11/05/13 Follow-up type: In-patient    Theodoro Kalata 11/05/2013, 6:37 AM

## 2013-11-05 NOTE — Plan of Care (Signed)
Problem: Discharge Progression Outcomes Goal: Barriers To Progression Addressed/Resolved Outcome: Completed/Met Date Met:  11/05/13 Goal: Activity appropriate for discharge plan Outcome: Completed/Met Date Met:  11/05/13 Goal: Tolerating diet Outcome: Completed/Met Date Met:  11/88/67 Goal: Complications resolved/controlled Outcome: Not Applicable Date Met:  73/73/66 Goal: Pain controlled with appropriate interventions Outcome: Completed/Met Date Met:  11/05/13 Goal: Afebrile, VS remain stable at discharge Outcome: Completed/Met Date Met:  11/05/13 Goal: Remove staples per MD order Outcome: Not Applicable Date Met:  81/59/47 Goal: MMR given as ordered Outcome: Not Applicable Date Met:  07/61/51 Goal: Discharge plan in place and appropriate Outcome: Completed/Met Date Met:  11/05/13 Goal: Other Discharge Outcomes/Goals Outcome: Not Applicable Date Met:  83/43/73

## 2013-11-05 NOTE — Progress Notes (Signed)
PPD #2 - SVD  Subjective:   Reports feeling well Tolerating po/ No nausea or vomiting Bleeding is light Pain controlled with Motrin Up ad lib / ambulatory / voiding without difficulties Newborn: breastfeeding     Objective:   VS:  VS:  Filed Vitals:   11/04/13 0227 11/04/13 1030 11/04/13 1825 11/05/13 0549  BP: 116/64 131/71 129/75 115/78  Pulse:  71 65 74  Temp: 98.3 F (36.8 C) 98 F (36.7 C) 98.1 F (36.7 C) 98.4 F (36.9 C)  TempSrc: Oral Oral Oral Oral  Resp: 18 18 18 20   Height:      Weight:      SpO2: 99%       LABS:   Recent Labs  11/03/13 0800 11/04/13 0612  WBC 9.1 13.4*  HGB 12.4 12.2  PLT 176 142*   Blood type: B POS (11/03 0800) Rubella: Immune (03/27 0000)   I&O: Intake/Output      11/04 0701 - 11/05 0700 11/05 0701 - 11/06 0700   Urine (mL/kg/hr)     Blood     Total Output       Net              Physical Exam: Alert and oriented x3 Abdomen: soft, non-tender, non-distended  Fundus: firm, non-tender, U-2 Perineum: Well approximated, no significant erythema, edema, or drainage; healing well. Lochia: minimal Extremities: Trace BLE edema, no calf pain or tenderness    Assessment:  PPD #2 G2P2002/ S/P:induced vaginal, 1st degree laceration  Doing well    Plan: Continue routine post partum orders D/C Home today   Laury Deep, M MSN, CNM 11/05/2013, 9:05 AM

## 2013-11-05 NOTE — Discharge Summary (Signed)
Obstetric Discharge Summary  Reason for Admission: Pt is a G2P2002 at [redacted]w[redacted]d IOL with favorable cervix.  Patient has received care at [facility] since 7.6 wks, with Dr. Ronita Hipps as primary provider.  Medications on Admission: Prescriptions prior to admission  Medication Sig Dispense Refill Last Dose  . calcium carbonate (TUMS - DOSED IN MG ELEMENTAL CALCIUM) 500 MG chewable tablet Chew 1 tablet by mouth daily as needed for indigestion or heartburn.   11/02/2013 at Unknown time  . Prenatal Vit-Fe Fumarate-FA (PRENATAL MULTIVITAMIN) TABS tablet Take 1 tablet by mouth daily.   11/01/2013 at Unknown time    Prenatal Labs: ABO, Rh: B POS (11/03 0800)  Antibody: NEG (11/03 0800) Rubella: Immune (03/27 0000)   RPR: NON REAC (11/03 0800)  HBsAg: Negative (03/27 0000)  HIV: Non-reactive (03/27 0000)  GTT : normal GBS: Positive (10/06 0000)   Prenatal Procedures: ultrasound Intrapartum Course: Admitted for IOL for favorable cervix / pitocin augmentation / Slight increase in bleeding Post delivery. No cervical lacerations. No extensions. Manual removal of retained placental fragment without complication. / normal progression to complete dilation / SVD of viable female infant with a 1st degree repair by Dr. Ronita Hipps / bleeding improved in immediate postpartum period Intrapartum Procedures: spontaneous vaginal delivery Postpartum Procedures: none Complications-Operative and Postpartum: 1st degree perineal laceration and manual removal of placental fragments  Labs: HEMOGLOBIN  Date Value Ref Range Status  11/04/2013 12.2 12.0 - 15.0 g/dL Final   HCT  Date Value Ref Range Status  11/04/2013 36.4 36.0 - 46.0 % Final   Lab Results  Component Value Date   PLT 142* 11/04/2013    Newborn Data: Live born female  Birth Weight: 8 lb 5.2 oz (3775 g) APGAR: 8, 9  Home with mother.   Discharge Information: Date: 11/05/2013 Discharge Diagnoses:  Pt is a [id number] at [redacted]w[redacted]d S/P Term  Pregnancy-delivered on 11/03/2013  Condition: stable Activity: pelvic rest Diet: routine Medications:    Medication List    TAKE these medications        calcium carbonate 500 MG chewable tablet  Commonly known as:  TUMS - dosed in mg elemental calcium  Chew 1 tablet by mouth daily as needed for indigestion or heartburn.     ibuprofen 600 MG tablet  Commonly known as:  ADVIL,MOTRIN  Take 1 tablet (600 mg total) by mouth every 6 (six) hours.     prenatal multivitamin Tabs tablet  Take 1 tablet by mouth daily.       Instructions: [facility] OB/GYN instruction booklet has been given and reviewed Discharge to: home     Follow-up Information    Follow up with Lovenia Kim, MD. Schedule an appointment as soon as possible for a visit in 6 weeks.   Specialty:  Obstetrics and Gynecology   Why:  postpartum visit   Contact information:   [address] [place] [address] [place], [place], Jerilynn Mages, MSN, CNM 11/05/2013, 11:22 AM

## 2014-01-12 ENCOUNTER — Other Ambulatory Visit: Payer: Self-pay | Admitting: Obstetrics and Gynecology

## 2014-01-13 ENCOUNTER — Encounter (HOSPITAL_COMMUNITY): Payer: Self-pay | Admitting: General Practice

## 2014-01-14 NOTE — H&P (Signed)
NAMEKEYARAH, MCROY NO.:  [id number]  MEDICAL RECORD NO.:  [id number]  LOCATION:  [facility]                         FACILITY:  [facility]  PHYSICIAN:  Lovenia Kim, M.D.DATE OF BIRTH:  Mar 31, 1985  DATE OF ADMISSION:  01/05/2014 DATE OF DISCHARGE:                             HISTORY & PHYSICAL   CHIEF COMPLAINT:  Desire for elective sterilization.  HISTORY OF PRESENT ILLNESS:  A 29 year old white female, G2, P2, who presents for elective sterilization.  MEDICATIONS:  Camila, prenatal vitamins, and Zyrtec.  ALLERGIES:  No known drug allergies.  FAMILY HISTORY:  Diabetes, hypertension, heart disease.  SOCIAL HISTORY:  Noncontributory.  PHYSICAL EXAMINATION:  GENERAL:  She is a well-developed, well- nourished, white female, in no acute distress. HEENT:  Normal. NECK:  Supple.  Full range of motion. LUNGS:  Clear. HEART:  Regular rate and rhythm. ABDOMEN:  Soft, nontender. PELVIC:  Normal size uterus.  No adnexal masses. EXTREMITIES:  There are no cords. NEUROLOGIC:  Nonfocal. SKIN:  Intact.  IMPRESSION:  Elective sterilization.  PLAN:  Proceed with Essure tubal ligation and possible laparoscopic tubal ligation.  Risks of anesthesia, infection, bleeding, injury to surrounding organs, and possible need for repair was discussed.  Delayed versus immediate complications to include bowel and bladder injury noted.  The patient acknowledges and wishes to proceed.  Need for postoperative HSG discussed.  Failure risks discussed.     Lovenia Kim, M.D.     RJT/MEDQ  D:  01/14/2014  T:  01/14/2014  Job:  [id number]

## 2014-01-15 ENCOUNTER — Encounter (HOSPITAL_COMMUNITY): Payer: Self-pay | Admitting: Anesthesiology

## 2014-01-15 ENCOUNTER — Ambulatory Visit (HOSPITAL_COMMUNITY): Payer: BLUE CROSS/BLUE SHIELD | Admitting: Anesthesiology

## 2014-01-15 ENCOUNTER — Encounter (HOSPITAL_COMMUNITY)
Admission: RE | Disposition: A | Discharge status: Home / Self Care | Payer: Self-pay | Source: Ambulatory Visit | Attending: Obstetrics and Gynecology

## 2014-01-15 ENCOUNTER — Ambulatory Visit (HOSPITAL_COMMUNITY)
Admission: RE | Admit: 2014-01-15 | Discharge: 2014-01-15 | Disposition: A | Discharge status: Home / Self Care | Payer: BLUE CROSS/BLUE SHIELD | Source: Ambulatory Visit | Attending: Obstetrics and Gynecology | Admitting: Obstetrics and Gynecology

## 2014-01-15 DIAGNOSIS — Z79899 Other long term (current) drug therapy: Secondary | ICD-10-CM | POA: Insufficient documentation

## 2014-01-15 DIAGNOSIS — Z302 Encounter for sterilization: Secondary | ICD-10-CM | POA: Diagnosis not present

## 2014-01-15 DIAGNOSIS — Z641 Problems related to multiparity: Secondary | ICD-10-CM | POA: Insufficient documentation

## 2014-01-15 HISTORY — PX: DILITATION & CURRETTAGE/HYSTROSCOPY WITH ESSURE: SHX5573

## 2014-01-15 LAB — CBC
HCT: 42.5 % (ref 36.0–46.0)
Hemoglobin: 14.4 g/dL (ref 12.0–15.0)
MCH: 29.1 pg (ref 26.0–34.0)
MCHC: 33.9 g/dL (ref 30.0–36.0)
MCV: 85.9 fL (ref 78.0–100.0)
PLATELETS: 258 10*3/uL (ref 150–400)
RBC: 4.95 MIL/uL (ref 3.87–5.11)
RDW: 12.8 % (ref 11.5–15.5)
WBC: 7 10*3/uL (ref 4.0–10.5)

## 2014-01-15 LAB — HCG, SERUM, QUALITATIVE: PREG SERUM: NEGATIVE

## 2014-01-15 SURGERY — DILATATION & CURETTAGE/HYSTEROSCOPY WITH ESSURE
Anesthesia: Monitor Anesthesia Care | Site: Vagina | Laterality: Bilateral

## 2014-01-15 MED ORDER — KETOROLAC TROMETHAMINE 30 MG/ML IJ SOLN
INTRAMUSCULAR | Status: AC
  Filled 2014-01-15: qty 1

## 2014-01-15 MED ORDER — FENTANYL CITRATE 0.05 MG/ML IJ SOLN: 25.0000 ug | INTRAMUSCULAR | Status: DC | PRN

## 2014-01-15 MED ORDER — OXYCODONE HCL 5 MG PO TABS
5.0000 mg | ORAL_TABLET | Freq: Once | ORAL | Status: DC | PRN
Start: 2014-01-15 — End: 2014-01-15

## 2014-01-15 MED ORDER — MEPERIDINE HCL 25 MG/ML IJ SOLN: 6.2500 mg | INTRAMUSCULAR | Status: DC | PRN

## 2014-01-15 MED ORDER — LIDOCAINE HCL (CARDIAC) 20 MG/ML IV SOLN
INTRAVENOUS | Status: DC | PRN
  Administered 2014-01-15: 50 mg via INTRAVENOUS

## 2014-01-15 MED ORDER — DEXAMETHASONE SODIUM PHOSPHATE 4 MG/ML IJ SOLN
INTRAMUSCULAR | Status: DC | PRN
  Administered 2014-01-15: 4 mg via INTRAVENOUS

## 2014-01-15 MED ORDER — MIDAZOLAM HCL 2 MG/2ML IJ SOLN
INTRAMUSCULAR | Status: AC
  Filled 2014-01-15: qty 2

## 2014-01-15 MED ORDER — CEFAZOLIN SODIUM-DEXTROSE 2-3 GM-% IV SOLR
INTRAVENOUS | Status: AC
  Filled 2014-01-15: qty 50

## 2014-01-15 MED ORDER — CEFAZOLIN SODIUM-DEXTROSE 2-3 GM-% IV SOLR
2.0000 g | INTRAVENOUS | Status: AC
  Administered 2014-01-15: 2 g via INTRAVENOUS

## 2014-01-15 MED ORDER — METOCLOPRAMIDE HCL 5 MG/ML IJ SOLN: 10.0000 mg | Freq: Once | INTRAMUSCULAR | Status: DC | PRN

## 2014-01-15 MED ORDER — SCOPOLAMINE 1 MG/3DAYS TD PT72
1.0000 | MEDICATED_PATCH | Freq: Once | TRANSDERMAL | Status: DC
  Administered 2014-01-15: 1.5 mg via TRANSDERMAL

## 2014-01-15 MED ORDER — KETOROLAC TROMETHAMINE 30 MG/ML IJ SOLN
INTRAMUSCULAR | Status: DC | PRN
  Administered 2014-01-15: 30 mg via INTRAVENOUS

## 2014-01-15 MED ORDER — BUPIVACAINE HCL (PF) 0.25 % IJ SOLN
INTRAMUSCULAR | Status: DC | PRN
  Administered 2014-01-15: 20 mL

## 2014-01-15 MED ORDER — LACTATED RINGERS IR SOLN
Status: DC | PRN
  Administered 2014-01-15: 3000 mL

## 2014-01-15 MED ORDER — PROPOFOL 10 MG/ML IV BOLUS
INTRAVENOUS | Status: AC
  Filled 2014-01-15: qty 20

## 2014-01-15 MED ORDER — SCOPOLAMINE 1 MG/3DAYS TD PT72
MEDICATED_PATCH | TRANSDERMAL | Status: AC
  Administered 2014-01-15: 1.5 mg via TRANSDERMAL
  Filled 2014-01-15: qty 1

## 2014-01-15 MED ORDER — FENTANYL CITRATE 0.05 MG/ML IJ SOLN
INTRAMUSCULAR | Status: DC | PRN
  Administered 2014-01-15 (×2): 50 ug via INTRAVENOUS

## 2014-01-15 MED ORDER — ONDANSETRON HCL 4 MG/2ML IJ SOLN
INTRAMUSCULAR | Status: DC | PRN
  Administered 2014-01-15: 4 mg via INTRAVENOUS

## 2014-01-15 MED ORDER — OXYCODONE HCL 5 MG/5ML PO SOLN: 5.0000 mg | Freq: Once | ORAL | Status: DC | PRN

## 2014-01-15 MED ORDER — DEXAMETHASONE SODIUM PHOSPHATE 4 MG/ML IJ SOLN
INTRAMUSCULAR | Status: AC
  Filled 2014-01-15: qty 1

## 2014-01-15 MED ORDER — PROPOFOL 10 MG/ML IV BOLUS
INTRAVENOUS | Status: DC | PRN
  Administered 2014-01-15: 30 mg via INTRAVENOUS
  Administered 2014-01-15: 80 mg via INTRAVENOUS

## 2014-01-15 MED ORDER — FENTANYL CITRATE 0.05 MG/ML IJ SOLN
INTRAMUSCULAR | Status: AC
  Filled 2014-01-15: qty 2

## 2014-01-15 MED ORDER — HYDROCODONE-IBUPROFEN 7.5-200 MG PO TABS: 1.0000 | ORAL_TABLET | Freq: Three times a day (TID) | ORAL | Status: DC | PRN

## 2014-01-15 MED ORDER — LACTATED RINGERS IV SOLN
INTRAVENOUS | Status: DC
  Administered 2014-01-15: 08:00:00 via INTRAVENOUS

## 2014-01-15 MED ORDER — MIDAZOLAM HCL 5 MG/5ML IJ SOLN
INTRAMUSCULAR | Status: DC | PRN
  Administered 2014-01-15: 2 mg via INTRAVENOUS

## 2014-01-15 MED ORDER — LIDOCAINE HCL (CARDIAC) 20 MG/ML IV SOLN
INTRAVENOUS | Status: AC
  Filled 2014-01-15: qty 5

## 2014-01-15 MED ORDER — BUPIVACAINE HCL (PF) 0.25 % IJ SOLN
INTRAMUSCULAR | Status: AC
  Filled 2014-01-15: qty 30

## 2014-01-15 MED ORDER — ONDANSETRON HCL 4 MG/2ML IJ SOLN
INTRAMUSCULAR | Status: AC
  Filled 2014-01-15: qty 2

## 2014-01-15 SURGICAL SUPPLY — 23 items
CATH ROBINSON RED A/P 16FR (CATHETERS) ×4 IMPLANT
CLOTH BEACON ORANGE TIMEOUT ST (SAFETY) ×4 IMPLANT
CONTAINER PREFILL 10% NBF 60ML (FORM) IMPLANT
DRSG COVADERM PLUS 2X2 (GAUZE/BANDAGES/DRESSINGS) ×8 IMPLANT
DRSG OPSITE POSTOP 3X4 (GAUZE/BANDAGES/DRESSINGS) IMPLANT
GLOVE BIO SURGEON STRL SZ7.5 (GLOVE) ×4 IMPLANT
GOWN STRL REUS W/TWL LRG LVL3 (GOWN DISPOSABLE) ×8 IMPLANT
KIT ESSURE FALLOPIAN CLOSURE (Ring) ×4 IMPLANT
LIQUID BAND (GAUZE/BANDAGES/DRESSINGS) ×4 IMPLANT
NEEDLE INSUFFLATION 120MM (ENDOMECHANICALS) ×4 IMPLANT
PACK LAPAROSCOPY BASIN (CUSTOM PROCEDURE TRAY) ×4 IMPLANT
PACK VAGINAL MINOR WOMEN LF (CUSTOM PROCEDURE TRAY) ×4 IMPLANT
PAD OB MATERNITY 4.3X12.25 (PERSONAL CARE ITEMS) ×4 IMPLANT
PAD POSITIONER PINK NONSTERILE (MISCELLANEOUS) ×4 IMPLANT
SOLUTION ELECTROLUBE (MISCELLANEOUS) ×4 IMPLANT
SUT VICRYL 0 UR6 27IN ABS (SUTURE) ×4 IMPLANT
SUT VICRYL 4-0 PS2 18IN ABS (SUTURE) IMPLANT
TOWEL OR 17X24 6PK STRL BLUE (TOWEL DISPOSABLE) ×8 IMPLANT
TROCAR XCEL DIL TIP R 11M (ENDOMECHANICALS) ×4 IMPLANT
TUBING AQUILEX INFLOW (TUBING) ×4 IMPLANT
TUBING AQUILEX OUTFLOW (TUBING) ×4 IMPLANT
WARMER LAPAROSCOPE (MISCELLANEOUS) ×4 IMPLANT
WATER STERILE IRR 1000ML POUR (IV SOLUTION) ×4 IMPLANT

## 2014-01-15 NOTE — Progress Notes (Signed)
Patient ID: Nancy Sullivan, female   DOB: [birth year], 29 y.o.   MRN: [id number] Patient seen and examined. Consent witnessed and signed. No changes noted. Update completed.

## 2014-01-15 NOTE — Anesthesia Preprocedure Evaluation (Addendum)
Anesthesia Evaluation  Patient identified by MRN, date of birth, ID band Patient awake    Reviewed: Allergy & Precautions, NPO status , Patient's Chart, lab work & pertinent test results  Airway Mallampati: II  TM Distance: >3 FB Neck ROM: Full    Dental no notable dental hx. (+) Teeth Intact   Pulmonary neg pulmonary ROS,  breath sounds clear to auscultation  Pulmonary exam normal       Cardiovascular negative cardio ROS  Rhythm:Regular Rate:Normal     Neuro/Psych negative neurological ROS  negative psych ROS   GI/Hepatic negative GI ROS, Neg liver ROS,   Endo/Other  negative endocrine ROS  Renal/GU negative Renal ROS  negative genitourinary   Musculoskeletal negative musculoskeletal ROS (+)   Abdominal   Peds  Hematology negative hematology ROS (+)   Anesthesia Other Findings   Reproductive/Obstetrics Desires Sterilization                             Anesthesia Physical Anesthesia Plan  ASA: I  Anesthesia Plan: General and MAC   Post-op Pain Management:    Induction: Intravenous  Airway Management Planned: Natural Airway and LMA  Additional Equipment:   Intra-op Plan:   Post-operative Plan:   Informed Consent: I have reviewed the patients History and Physical, chart, labs and discussed the procedure including the risks, benefits and alternatives for the proposed anesthesia with the patient or authorized representative who has indicated his/her understanding and acceptance.   Dental advisory given  Plan Discussed with: CRNA, Anesthesiologist and Surgeon  Anesthesia Plan Comments:         Anesthesia Quick Evaluation

## 2014-01-15 NOTE — Op Note (Signed)
NAMEANGEL, Nancy Sullivan              ACCOUNT NO.:  [id number]  MEDICAL RECORD NO.:  [id number]  LOCATION:  [facility]                          FACILITY:  [facility]  PHYSICIAN:  Lovenia Kim, M.D.DATE OF BIRTH:  Oct 12, 1985  DATE OF PROCEDURE:  01/15/2014 DATE OF DISCHARGE:                              OPERATIVE REPORT   PREOPERATIVE DIAGNOSIS:  Desire for elective sterilization.  POSTOPERATIVE DIAGNOSIS:  Desire for elective sterilization.  PROCEDURE:  Diagnostic hysteroscopy, dilation and curettage, Essure tubal ligation.  SURGEON:  Lovenia Kim, MD  ASSISTANT:  None.  ANESTHESIA:  MAC and paracervical.  ESTIMATED BLOOD LOSS:  Less than 50 mL.  FLUID DEFICIT:  Unable to be calculated due to malfunctioning of the machine.  DRAINS:  None.  COUNTS:  Correct.  DISPOSITION:  The patient to recovery in good condition.  BRIEF OPERATIVE NOTE:  After being apprised of the risks of anesthesia, infection, bleeding, injury to surrounding organs, possible need for repair, delayed versus immediate complications to include bowel and bladder injury, possible need for repair, the patient was brought to the operating room, where she was administered IV sedation without difficulty, prepped and draped in usual sterile fashion, catheterized till the bladder was empty.  Exam under anesthesia revealed a small anteflexed uterus and no adnexal masses.  Upon entry, the paracervical block was placed in a standard fashion 20 mL total and cervix was easily dilated up to a #19 Pratt dilator.  Hysteroscope placed.  Visualization revealed a normal endometrial cavity and bilateral tubal ostia.  At this time, the Essure device was placed with a standard placement into the left tube.  Procedures were followed at the end of the procedure.  There were 2 trailing coils visualized on the right side.  The procedure was done also in the standard fashion.  However, upon deployment, the Essure coil retracted  completely into the tube and trailing coils were not seen.  Decision was made due to normal deployment to not proceed further at this point due to well confirmed placement.  No evidence of uterine perforation was noted.  The patient tolerated the procedure well.  All instruments were removed.  She was transferred to recovery in good condition.     Lovenia Kim, M.D.     RJT/MEDQ  D:  01/15/2014  T:  01/15/2014  Job:  [id number]

## 2014-01-15 NOTE — Anesthesia Postprocedure Evaluation (Signed)
  Anesthesia Post-op Note  Patient: Nancy Sullivan  Procedure(s) Performed: Procedure(s) with comments: DILATATION & CURETTAGE/HYSTEROSCOPY WITH ESSURE (Bilateral) - 1 hr.  Patient Location: PACU  Anesthesia Type:MAC  Level of Consciousness: awake, alert  and oriented  Airway and Oxygen Therapy: Patient Spontanous Breathing  Post-op Pain: mild  Post-op Assessment: Post-op Vital signs reviewed, Patient's Cardiovascular Status Stable, Respiratory Function Stable, Patent Airway, No signs of Nausea or vomiting and Pain level controlled  Post-op Vital Signs: Reviewed and stable  Last Vitals:  Filed Vitals:   01/15/14 1015  BP: 121/72  Pulse: 52  Temp: 36.7 C  Resp: 16    Complications: No apparent anesthesia complications

## 2014-01-15 NOTE — Discharge Instructions (Signed)
DISCHARGE INSTRUCTIONS: ESSURE The following instructions have been prepared to help you care for yourself upon your return home.  May remove Scop patch on or before 01/17/2014  May use Ibuprofen products after 3:08pm as needed for cramps   Personal hygiene:  Use sanitary pads for vaginal drainage, not tampons.  Shower the day after your procedure.  NO tub baths, pools or Jacuzzis for 2-3 weeks.  Wipe front to back after using the bathroom.  Activity and limitations:  Do NOT drive or operate any equipment for 24 hours. The effects of anesthesia are still present and drowsiness may result.  Do NOT rest in bed all day.  Walking is encouraged.  Walk up and down stairs slowly.  You may resume your normal activity in one to two days or as indicated by your physician.  Sexual activity: NO intercourse for at least 2 weeks after the procedure, or as indicated by your physician.  Diet: Eat a light meal as desired this evening. You may resume your usual diet tomorrow.  Return to work: You may resume your work activities in one to two days or as indicated by your doctor.  What to expect after your surgery: Expect to have vaginal bleeding/discharge for 2-3 days and spotting for up to 10 days. It is not unusual to have soreness for up to 1-2 weeks. You may have a slight burning sensation when you urinate for the first day. Mild cramps may continue for a couple of days. You may have a regular period in 2-6 weeks.  Call your doctor for any of the following:  Excessive vaginal bleeding, saturating and changing one pad every hour.  Inability to urinate 6 hours after discharge from hospital.  Pain not relieved by pain medication.  Fever of 100.4 F or greater.  Unusual vaginal discharge or odor.   Call for an appointment:    Patients signature: ______________________  Nurses signature ________________________  Support person's signature_______________________

## 2014-01-15 NOTE — Op Note (Signed)
01/15/2014  9:25 AM  PATIENT:  Karl Luke  29 y.o. female  PRE-OPERATIVE DIAGNOSIS:  Desires Sterilization  POST-OPERATIVE DIAGNOSIS:  Desires Sterilization  PROCEDURE:  Procedure(s): DIAGNOSTIC HYSTEROSCOPY WITH ESSURE STERILIZATION  SURGEON:  Surgeon(s): Lovenia Kim, MD  ASSISTANTS: none   ANESTHESIA:   local and general  ESTIMATED BLOOD LOSS: minimal  DRAINS: none   LOCAL MEDICATIONS USED:  MARCAINE    and Amount: 20 ml  SPECIMEN:  No Specimen  DISPOSITION OF SPECIMEN:  N/A  COUNTS:  YES  DICTATION #: [id number]  PLAN OF CARE: DC HOME  PATIENT DISPOSITION:  PACU - hemodynamically stable.

## 2014-01-15 NOTE — Transfer of Care (Signed)
Immediate Anesthesia Transfer of Care Note  Patient: Nancy Sullivan  Procedure(s) Performed: Procedure(s) with comments: DILATATION & CURETTAGE/HYSTEROSCOPY WITH ESSURE (Bilateral) - 1 hr.  Patient Location: PACU  Anesthesia Type:General  Level of Consciousness: awake, alert  and oriented  Airway & Oxygen Therapy: Patient Spontanous Breathing and Patient connected to nasal cannula oxygen  Post-op Assessment: Report given to PACU RN  Post vital signs: Reviewed  Complications: No apparent anesthesia complications

## 2014-01-18 ENCOUNTER — Encounter (HOSPITAL_COMMUNITY): Payer: Self-pay | Admitting: Obstetrics and Gynecology

## 2014-04-12 ENCOUNTER — Other Ambulatory Visit (HOSPITAL_COMMUNITY): Payer: Self-pay | Admitting: Obstetrics and Gynecology

## 2014-04-12 DIAGNOSIS — N971 Female infertility of tubal origin: Secondary | ICD-10-CM

## 2014-04-16 ENCOUNTER — Ambulatory Visit (HOSPITAL_COMMUNITY)
Admission: RE | Admit: 2014-04-16 | Discharge: 2014-04-16 | Disposition: A | Discharge status: Home / Self Care | Payer: BLUE CROSS/BLUE SHIELD | Source: Ambulatory Visit | Attending: Obstetrics and Gynecology | Admitting: Obstetrics and Gynecology

## 2014-04-16 DIAGNOSIS — Z3049 Encounter for surveillance of other contraceptives: Secondary | ICD-10-CM | POA: Diagnosis not present

## 2014-04-16 DIAGNOSIS — N971 Female infertility of tubal origin: Secondary | ICD-10-CM

## 2014-04-16 MED ORDER — IOHEXOL 300 MG/ML  SOLN
20.0000 mL | Freq: Once | INTRAMUSCULAR | Status: AC | PRN
  Administered 2014-04-16: 20 mL via ORAL

## 2014-04-26 ENCOUNTER — Other Ambulatory Visit: Payer: Self-pay | Admitting: Obstetrics and Gynecology

## 2014-05-04 NOTE — Patient Instructions (Addendum)
   Your procedure is scheduled on: Friday, May 6  Enter through the Main Entrance of [facility] at: 11:30 AM Pick up the phone at the desk and dial [phone] and inform us of your arrival.  Please call this number if you have any problems the morning of surgery: [phone]  Remember: Do not eat food after midnight: Thursday Do not drink clear liquids after: 9 AM Friday, day of surgery Take these medicines the morning of surgery with a SIP OF WATER: zyrtec if needed  Do not wear jewelry, make-up, or FINGER nail polish No metal in your hair or on your body. Do not wear lotions, powders, perfumes.  You may wear deodorant.  Do not bring valuables to the hospital. Contacts, dentures or bridgework may not be worn into surgery.  Patients discharged on the day of surgery will not be allowed to drive home.  Home with husband Nancy Sullivan cell [phone]

## 2014-05-05 NOTE — Anesthesia Preprocedure Evaluation (Addendum)
Anesthesia Evaluation  Patient identified by MRN, date of birth, ID band Patient awake    Reviewed: Allergy & Precautions, NPO status , Patient's Chart, lab work & pertinent test results  Airway Mallampati: III   Neck ROM: Full    Dental  (+) Teeth Intact, Dental Advisory Given   Pulmonary neg pulmonary ROS,  breath sounds clear to auscultation        Cardiovascular negative cardio ROS  Rhythm:Regular     Neuro/Psych    GI/Hepatic Neg liver ROS,   Endo/Other  negative endocrine ROS  Renal/GU negative Renal ROS     Musculoskeletal   Abdominal (+)  Abdomen: soft.    Peds  Hematology   Anesthesia Other Findings   Reproductive/Obstetrics                            Anesthesia Physical Anesthesia Plan  ASA: I  Anesthesia Plan: General   Post-op Pain Management:    Induction: Intravenous  Airway Management Planned: Oral ETT  Additional Equipment:   Intra-op Plan:   Post-operative Plan: Extubation in OR  Informed Consent: I have reviewed the patients History and Physical, chart, labs and discussed the procedure including the risks, benefits and alternatives for the proposed anesthesia with the patient or authorized representative who has indicated his/her understanding and acceptance.     Plan Discussed with:   Anesthesia Plan Comments:         Anesthesia Quick Evaluation

## 2014-05-06 ENCOUNTER — Encounter (HOSPITAL_COMMUNITY)
Admission: RE | Admit: 2014-05-06 | Discharge: 2014-05-06 | Disposition: A | Discharge status: Home / Self Care | Payer: BLUE CROSS/BLUE SHIELD | Source: Ambulatory Visit | Attending: Obstetrics and Gynecology | Admitting: Obstetrics and Gynecology

## 2014-05-06 ENCOUNTER — Encounter (HOSPITAL_COMMUNITY): Payer: Self-pay

## 2014-05-06 DIAGNOSIS — T83428A Displacement of other prosthetic devices, implants and grafts of genital tract, initial encounter: Secondary | ICD-10-CM | POA: Diagnosis present

## 2014-05-06 DIAGNOSIS — Z302 Encounter for sterilization: Secondary | ICD-10-CM | POA: Diagnosis not present

## 2014-05-06 DIAGNOSIS — Y838 Other surgical procedures as the cause of abnormal reaction of the patient, or of later complication, without mention of misadventure at the time of the procedure: Secondary | ICD-10-CM | POA: Diagnosis not present

## 2014-05-06 HISTORY — DX: Other seasonal allergic rhinitis: J30.2

## 2014-05-06 LAB — CBC
HCT: 37.2 % (ref 36.0–46.0)
HEMOGLOBIN: 12.7 g/dL (ref 12.0–15.0)
MCH: 28.6 pg (ref 26.0–34.0)
MCHC: 34.1 g/dL (ref 30.0–36.0)
MCV: 83.8 fL (ref 78.0–100.0)
Platelets: 232 10*3/uL (ref 150–400)
RBC: 4.44 MIL/uL (ref 3.87–5.11)
RDW: 12.7 % (ref 11.5–15.5)
WBC: 6.8 10*3/uL (ref 4.0–10.5)

## 2014-05-06 NOTE — H&P (Signed)
[name], [name] NO.:  [id number]  MEDICAL RECORD NO.:  [id number]  LOCATION:  [facility]                         FACILITY:  [facility]  PHYSICIAN:  Lovenia Kim, M.D.DATE OF BIRTH:  1985-09-25  DATE OF ADMISSION:  04/20/2014 DATE OF DISCHARGE:                             HISTORY & PHYSICAL   CHIEF COMPLAINT:  Failed Essure with tubal ligation.  HISTORY OF PRESENT ILLNESS:  A 29 year old white female, G2, P2, who has had an uncomplicated Essure sterilization in January 2016, presented for her HSG in April 2016, with notable migration of her Essure outside the right fallopian tube, otherwise opacified left fallopian tube was noted. She presents now for tubal ligation on the right and removal of her Essure implant from the right side.  She has no known drug allergies.  Medications are minipill, Zyrtec, and prenatal vitamins.  She is a nonsmoker and nondrinker.  She denies domestic or physical violence.  She has a family history of diabetes, hypertension, heart disease.  PHYSICAL EXAMINATION:  GENERAL:  She is a well-developed, well-nourished white female, in no acute distress. HEENT:  Normal. NECK:  Supple.  Full range of motion. LUNGS:  Clear. HEART:  Regular rate and rhythm. ABDOMEN:  Soft, nontender. PELVIC:  There is normal-sized uterus.  No adnexal masses. EXTREMITIES:  There are no cords. NEUROLOGIC:  Nonfocal. SKIN:  Intact.  IMPRESSION:  Failed Essure with secondary migration, for removal and sterilization of right tube and diagnostic laparoscopy, removal of Essure, and right tubal ligation.  Risks of anesthesia, infection, bleeding, injury to surrounding organs, and possible need for repair was discussed.  Delayed versus immediate complications to include bowel and bladder injury noted.  Failure risk of tubal ligation of 05/998 discussed.  The patient acknowledges and wishes to proceed.     Lovenia Kim, M.D.     RJT/MEDQ  D:   05/06/2014  T:  05/06/2014  Job:  [id number]

## 2014-05-07 ENCOUNTER — Ambulatory Visit (HOSPITAL_COMMUNITY)
Admission: RE | Admit: 2014-05-07 | Discharge: 2014-05-07 | Disposition: A | Discharge status: Home / Self Care | Payer: BLUE CROSS/BLUE SHIELD | Source: Ambulatory Visit | Attending: Obstetrics and Gynecology | Admitting: Obstetrics and Gynecology

## 2014-05-07 ENCOUNTER — Ambulatory Visit (HOSPITAL_COMMUNITY): Payer: BLUE CROSS/BLUE SHIELD | Admitting: Anesthesiology

## 2014-05-07 ENCOUNTER — Encounter (HOSPITAL_COMMUNITY)
Admission: RE | Disposition: A | Discharge status: Home / Self Care | Payer: Self-pay | Source: Ambulatory Visit | Attending: Obstetrics and Gynecology

## 2014-05-07 ENCOUNTER — Encounter (HOSPITAL_COMMUNITY): Payer: Self-pay | Admitting: Anesthesiology

## 2014-05-07 DIAGNOSIS — T83428A Displacement of other prosthetic devices, implants and grafts of genital tract, initial encounter: Secondary | ICD-10-CM | POA: Diagnosis not present

## 2014-05-07 DIAGNOSIS — Z302 Encounter for sterilization: Secondary | ICD-10-CM | POA: Insufficient documentation

## 2014-05-07 DIAGNOSIS — Y838 Other surgical procedures as the cause of abnormal reaction of the patient, or of later complication, without mention of misadventure at the time of the procedure: Secondary | ICD-10-CM | POA: Insufficient documentation

## 2014-05-07 HISTORY — PX: LAPAROSCOPIC TUBAL LIGATION: SHX1937

## 2014-05-07 LAB — HCG, SERUM, QUALITATIVE: PREG SERUM: NEGATIVE

## 2014-05-07 SURGERY — LIGATION, FALLOPIAN TUBE, LAPAROSCOPIC
Anesthesia: General | Laterality: Bilateral

## 2014-05-07 MED ORDER — LIDOCAINE HCL (CARDIAC) 20 MG/ML IV SOLN
INTRAVENOUS | Status: AC
  Filled 2014-05-07: qty 5

## 2014-05-07 MED ORDER — ROCURONIUM BROMIDE 100 MG/10ML IV SOLN
INTRAVENOUS | Status: DC | PRN
  Administered 2014-05-07: 30 mg via INTRAVENOUS
  Administered 2014-05-07: 5 mg via INTRAVENOUS

## 2014-05-07 MED ORDER — ROCURONIUM BROMIDE 100 MG/10ML IV SOLN
INTRAVENOUS | Status: AC
  Filled 2014-05-07: qty 1

## 2014-05-07 MED ORDER — KETOROLAC TROMETHAMINE 30 MG/ML IJ SOLN
INTRAMUSCULAR | Status: AC
  Filled 2014-05-07: qty 1

## 2014-05-07 MED ORDER — SODIUM CHLORIDE 0.9 % IJ SOLN
INTRAMUSCULAR | Status: AC
Start: 2014-05-07 — End: 2014-05-07
  Filled 2014-05-07: qty 10

## 2014-05-07 MED ORDER — LIDOCAINE HCL (CARDIAC) 20 MG/ML IV SOLN
INTRAVENOUS | Status: DC | PRN
  Administered 2014-05-07: 50 mg via INTRAVENOUS

## 2014-05-07 MED ORDER — GLYCOPYRROLATE 0.2 MG/ML IJ SOLN
INTRAMUSCULAR | Status: DC | PRN
  Administered 2014-05-07: 0.4 mg via INTRAVENOUS
  Administered 2014-05-07: 0.2 mg via INTRAVENOUS

## 2014-05-07 MED ORDER — CEFAZOLIN SODIUM-DEXTROSE 2-3 GM-% IV SOLR
2.0000 g | INTRAVENOUS | Status: AC
  Administered 2014-05-07: 2 g via INTRAVENOUS

## 2014-05-07 MED ORDER — PROPOFOL 10 MG/ML IV BOLUS
INTRAVENOUS | Status: DC | PRN
  Administered 2014-05-07: 180 mg via INTRAVENOUS

## 2014-05-07 MED ORDER — MEPERIDINE HCL 25 MG/ML IJ SOLN: 6.2500 mg | INTRAMUSCULAR | Status: DC | PRN

## 2014-05-07 MED ORDER — NEOSTIGMINE METHYLSULFATE 10 MG/10ML IV SOLN
INTRAVENOUS | Status: DC | PRN
  Administered 2014-05-07: 1 mg via INTRAVENOUS
  Administered 2014-05-07: 2 mg via INTRAVENOUS

## 2014-05-07 MED ORDER — DEXAMETHASONE SODIUM PHOSPHATE 4 MG/ML IJ SOLN
INTRAMUSCULAR | Status: AC
  Filled 2014-05-07: qty 1

## 2014-05-07 MED ORDER — SCOPOLAMINE 1 MG/3DAYS TD PT72
MEDICATED_PATCH | TRANSDERMAL | Status: AC
  Administered 2014-05-07: 1.5 mg via TRANSDERMAL
  Filled 2014-05-07: qty 1

## 2014-05-07 MED ORDER — LACTATED RINGERS IV SOLN
INTRAVENOUS | Status: DC
  Administered 2014-05-07 (×2): via INTRAVENOUS

## 2014-05-07 MED ORDER — ONDANSETRON HCL 4 MG/2ML IJ SOLN
INTRAMUSCULAR | Status: DC | PRN
  Administered 2014-05-07: 4 mg via INTRAVENOUS

## 2014-05-07 MED ORDER — BUPIVACAINE HCL (PF) 0.25 % IJ SOLN
INTRAMUSCULAR | Status: DC | PRN
  Administered 2014-05-07: 10 mL

## 2014-05-07 MED ORDER — HYDROCODONE-IBUPROFEN 7.5-200 MG PO TABS: 1.0000 | ORAL_TABLET | Freq: Three times a day (TID) | ORAL | Status: AC | PRN

## 2014-05-07 MED ORDER — PROPOFOL 10 MG/ML IV BOLUS
INTRAVENOUS | Status: AC
  Filled 2014-05-07: qty 20

## 2014-05-07 MED ORDER — 0.9 % SODIUM CHLORIDE (POUR BTL) OPTIME
TOPICAL | Status: DC | PRN
  Administered 2014-05-07: 1000 mL

## 2014-05-07 MED ORDER — BUPIVACAINE HCL (PF) 0.25 % IJ SOLN
INTRAMUSCULAR | Status: AC
  Filled 2014-05-07: qty 30

## 2014-05-07 MED ORDER — KETOROLAC TROMETHAMINE 30 MG/ML IJ SOLN
INTRAMUSCULAR | Status: DC | PRN
  Administered 2014-05-07: 30 mg via INTRAVENOUS

## 2014-05-07 MED ORDER — CEFAZOLIN SODIUM-DEXTROSE 2-3 GM-% IV SOLR
INTRAVENOUS | Status: AC
  Filled 2014-05-07: qty 50

## 2014-05-07 MED ORDER — SCOPOLAMINE 1 MG/3DAYS TD PT72
1.0000 | MEDICATED_PATCH | Freq: Once | TRANSDERMAL | Status: DC
  Administered 2014-05-07: 1.5 mg via TRANSDERMAL

## 2014-05-07 MED ORDER — FENTANYL CITRATE (PF) 100 MCG/2ML IJ SOLN
INTRAMUSCULAR | Status: DC | PRN
  Administered 2014-05-07: 100 ug via INTRAVENOUS
  Administered 2014-05-07: 50 ug via INTRAVENOUS

## 2014-05-07 MED ORDER — MIDAZOLAM HCL 2 MG/2ML IJ SOLN
INTRAMUSCULAR | Status: DC | PRN
  Administered 2014-05-07: 2 mg via INTRAVENOUS

## 2014-05-07 MED ORDER — FENTANYL CITRATE (PF) 250 MCG/5ML IJ SOLN
INTRAMUSCULAR | Status: AC
  Filled 2014-05-07: qty 5

## 2014-05-07 MED ORDER — FENTANYL CITRATE (PF) 100 MCG/2ML IJ SOLN: 25.0000 ug | INTRAMUSCULAR | Status: DC | PRN

## 2014-05-07 MED ORDER — MIDAZOLAM HCL 2 MG/2ML IJ SOLN
INTRAMUSCULAR | Status: AC
  Filled 2014-05-07: qty 2

## 2014-05-07 MED ORDER — NEOSTIGMINE METHYLSULFATE 10 MG/10ML IV SOLN
INTRAVENOUS | Status: AC
  Filled 2014-05-07: qty 1

## 2014-05-07 MED ORDER — ONDANSETRON HCL 4 MG/2ML IJ SOLN
INTRAMUSCULAR | Status: AC
  Filled 2014-05-07: qty 2

## 2014-05-07 MED ORDER — GLYCOPYRROLATE 0.2 MG/ML IJ SOLN
INTRAMUSCULAR | Status: AC
  Filled 2014-05-07: qty 3

## 2014-05-07 MED ORDER — DEXAMETHASONE SODIUM PHOSPHATE 10 MG/ML IJ SOLN
INTRAMUSCULAR | Status: DC | PRN
  Administered 2014-05-07: 4 mg via INTRAVENOUS

## 2014-05-07 MED ORDER — PROMETHAZINE HCL 25 MG/ML IJ SOLN: 6.2500 mg | INTRAMUSCULAR | Status: DC | PRN

## 2014-05-07 SURGICAL SUPPLY — 18 items
CATH ROBINSON RED A/P 16FR (CATHETERS) ×2 IMPLANT
CLOTH BEACON ORANGE TIMEOUT ST (SAFETY) ×2 IMPLANT
DRSG COVADERM PLUS 2X2 (GAUZE/BANDAGES/DRESSINGS) ×4 IMPLANT
DRSG OPSITE POSTOP 3X4 (GAUZE/BANDAGES/DRESSINGS) IMPLANT
GLOVE BIO SURGEON STRL SZ7.5 (GLOVE) ×2 IMPLANT
GOWN STRL REUS W/TWL LRG LVL3 (GOWN DISPOSABLE) ×4 IMPLANT
LIQUID BAND (GAUZE/BANDAGES/DRESSINGS) ×2 IMPLANT
NEEDLE INSUFFLATION 120MM (ENDOMECHANICALS) ×2 IMPLANT
PACK LAPAROSCOPY BASIN (CUSTOM PROCEDURE TRAY) ×2 IMPLANT
PAD POSITIONER PINK NONSTERILE (MISCELLANEOUS) ×2 IMPLANT
SOLUTION ELECTROLUBE (MISCELLANEOUS) ×2 IMPLANT
SUT VICRYL 0 UR6 27IN ABS (SUTURE) ×2 IMPLANT
SUT VICRYL 4-0 PS2 18IN ABS (SUTURE) IMPLANT
TOWEL OR 17X24 6PK STRL BLUE (TOWEL DISPOSABLE) ×4 IMPLANT
TROCAR XCEL DIL TIP R 11M (ENDOMECHANICALS) ×2 IMPLANT
TROCAR XCEL NON-BLD 5MMX100MML (ENDOMECHANICALS) ×1 IMPLANT
WARMER LAPAROSCOPE (MISCELLANEOUS) ×2 IMPLANT
WATER STERILE IRR 1000ML POUR (IV SOLUTION) ×2 IMPLANT

## 2014-05-07 NOTE — Transfer of Care (Signed)
Immediate Anesthesia Transfer of Care Note  Patient: Nancy Sullivan  Procedure(s) Performed: Procedure(s): LAPAROSCOPIC TUBAL LIGATION AND RIGHT FALLOPIAN TUBAL ESSURE IMPLANT REMOVAL (Bilateral)  Patient Location: PACU  Anesthesia Type:General  Level of Consciousness: awake  Airway & Oxygen Therapy: Patient Spontanous Breathing  Post-op Assessment: Report given to PACU RN  Post vital signs: stable  Filed Vitals:   05/07/14 1140  BP: 116/73  Pulse: 65  Temp: 36.3 C  Resp: 18    Complications: No apparent anesthesia complications

## 2014-05-07 NOTE — Anesthesia Postprocedure Evaluation (Signed)
  Anesthesia Post-op Note  Patient: Nancy Sullivan  Procedure(s) Performed: Procedure(s): LAPAROSCOPIC TUBAL LIGATION AND RIGHT FALLOPIAN TUBAL ESSURE IMPLANT REMOVAL (Bilateral)  Patient Location: PACU  Anesthesia Type:General  Level of Consciousness: awake and alert   Airway and Oxygen Therapy: Patient Spontanous Breathing and Patient connected to nasal cannula oxygen  Post-op Pain: none  Post-op Assessment: Post-op Vital signs reviewed, Patient's Cardiovascular Status Stable, Respiratory Function Stable, Patent Airway and No signs of Nausea or vomiting  Post-op Vital Signs: Reviewed and stable  Last Vitals:  Filed Vitals:   05/07/14 1140  BP: 116/73  Pulse: 65  Temp: 36.3 C  Resp: 18    Complications: No apparent anesthesia complications

## 2014-05-07 NOTE — Progress Notes (Signed)
Patient ID: Nancy Sullivan, female   DOB: 1985-06-22, 29 y.o.   MRN: [id number] Patient seen and examined. Consent witnessed and signed. No changes noted. Update completed.

## 2014-05-07 NOTE — Op Note (Signed)
05/07/2014  2:04 PM  PATIENT:  Nancy Sullivan  29 y.o. female  PRE-OPERATIVE DIAGNOSIS:  Failed Essure, Desires Sterilization  POST-OPERATIVE DIAGNOSIS:  Failed Essure, Desires Sterilization  PROCEDURE:  Procedure(s): LAPAROSCOPIC TUBAL LIGATION - BILATERAL REMOVAL OF MIGRATED ESSURE FROM CUL DE [place]  SURGEON:  Surgeon(s): Brien Few, MD  ASSISTANTS: DAWSON, CNM   ANESTHESIA:   local and general  ESTIMATED BLOOD LOSS: * No blood loss amount entered *   DRAINS: none   LOCAL MEDICATIONS USED:  MARCAINE    and Amount: 20 ml  SPECIMEN:  Source of Specimen:  ESSURE  DISPOSITION OF SPECIMEN:  PATHOLOGY  COUNTS:  YES  DICTATION #: [id number]  PLAN OF CARE: DC HOME  PATIENT DISPOSITION:  PACU - hemodynamically stable.

## 2014-05-07 NOTE — Discharge Instructions (Signed)

## 2014-05-08 NOTE — Op Note (Signed)
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  PROCEDURES:  Diagnostic laparoscopy with bilateral tubal ligation and removal of migrated Essure from the cul-de-sac.  SURGEON:  Lovenia Kim, M.D.  ASSISTANT:  Laqueta Carina, [employer].  ESTIMATED BLOOD LOSS:  Less than 50 mL.  COMPLICATIONS:  None.  DRAINS:  None.  COUNTS:  Correct.  DISPOSITION:  The patient to recovery room in good condition.  BRIEF OPERATIVE NOTE:  After being apprised of risks of anesthesia, infection, bleeding, injury to surrounding organs, possible need for repair, delayed versus immediate complications include bowel and bladder injury, possible need for repair, the patient was brought to the operating room where she was administered general anesthetic without complications, prepped and draped in usual sterile fashion, catheterized, the bladder was emptied 200 mL of urine.  Hulka tenaculum placed per vagina.  Infraumbilical incision made with a scalpel.  Veress needle placed, opening pressure -1, 3 L, CO2 insufflated without difficulty.  Atraumatic trocar placement without difficulty.  Pictures were taken.  Normal appearing right and left tube were noted.  Normal anterior cul-de-sac.  Upon elevation of the uterus, there was noted an intact Essure coil that was in the posterior cul-de-sac adherent to the bowel serosa.  A 5 mm port was then made suprapubically and the  right tube was traced out to the fimbriated end.  Bipolar cautery used to coagulate the tube in 3 contiguous areas, the ampullary-isthmic portion of the tube.  Similarly, the same procedure was done on the left tube. Both tubes were divided.  Tubal lumens were visualized.  At this time, the Essure implant was grasped using a long grasper and removed from the pelvis without difficulty.  No bleeding was noted.  Good hemostasis was noted bilaterally from both tubes and the cul-de-sac.  CO2 was released and revisualization with Essure and good hemostasis.  All instruments were then removed under direct visualization.  Positive pressure x3 was applied.  CO2 was released.  Incisions were closed using 0 Vicryl, 4-0 Vicryl, and Dermabond.  Hulka tenaculum was removed from the vagina.  The Essure implant that was removed sent to Pathology for confirmation.  The patient tolerated the procedure well, was awakened, and transferred to recovery in good condition.     Lovenia Kim, M.D.     RJT/MEDQ  D:  05/07/2014  T:  05/08/2014  Job:  [id number]

## 2014-05-10 ENCOUNTER — Encounter (HOSPITAL_COMMUNITY): Payer: Self-pay | Admitting: Obstetrics and Gynecology

## 2016-02-16 DIAGNOSIS — H04123 Dry eye syndrome of bilateral lacrimal glands: Secondary | ICD-10-CM | POA: Diagnosis not present

## 2016-05-09 DIAGNOSIS — Z0000 Encounter for general adult medical examination without abnormal findings: Secondary | ICD-10-CM | POA: Diagnosis not present

## 2016-05-09 DIAGNOSIS — Z13220 Encounter for screening for lipoid disorders: Secondary | ICD-10-CM | POA: Diagnosis not present

## 2016-09-28 DIAGNOSIS — Z01419 Encounter for gynecological examination (general) (routine) without abnormal findings: Secondary | ICD-10-CM | POA: Diagnosis not present

## 2017-01-18 IMAGING — RF DG HYSTEROGRAM
8 series · 8 of 8 positions shown · IV contrast (omnipaque)
Comparison: None.

CLINICAL DATA: Post placement of Essure implants for permanent
contraception 3 months ago

EXAM:
HYSTEROSALPINGOGRAM
TECHNIQUE: Following cleansing of the cervix and vagina with Betadine solution,
a hysterosalpingogram was performed using a 5-French
hysterosalpingogram catheter and Omnipaque 300 contrast. The patient
tolerated the examination without difficulty.

[Series 1: run · 1 of 1 slices shown (1 of 8)]
[im 1/1]
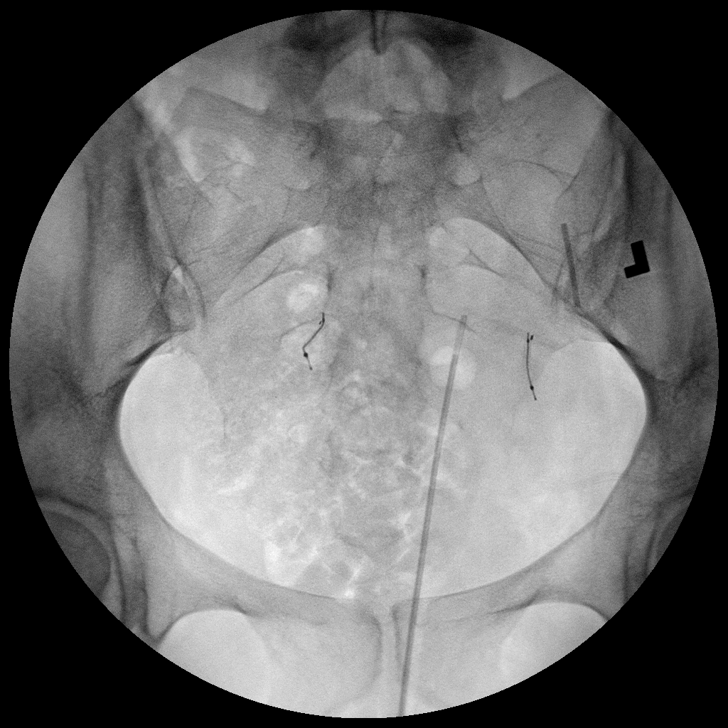

[Series 2: run · 1 of 1 slices shown (2 of 8)]
[im 1/1]
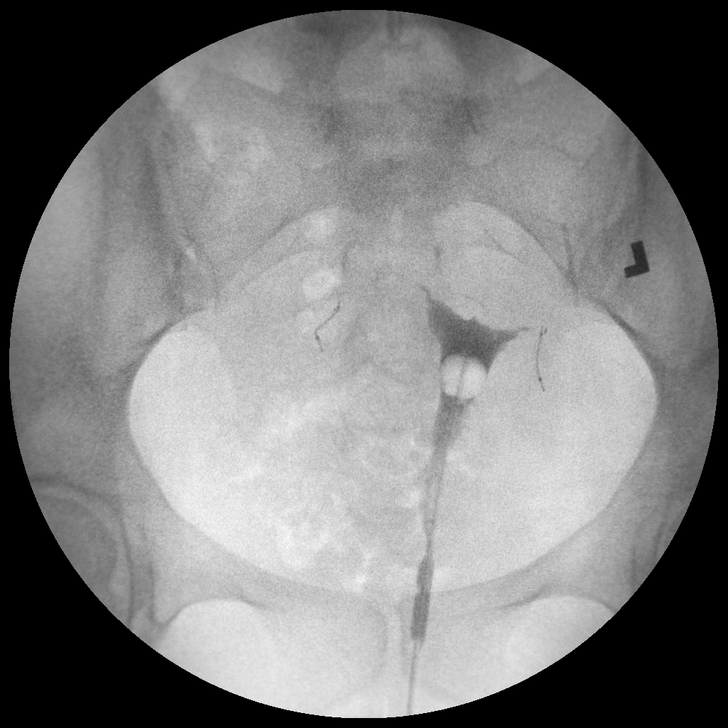

[Series 3: run · 1 of 1 slices shown (3 of 8)]
[im 1/1]
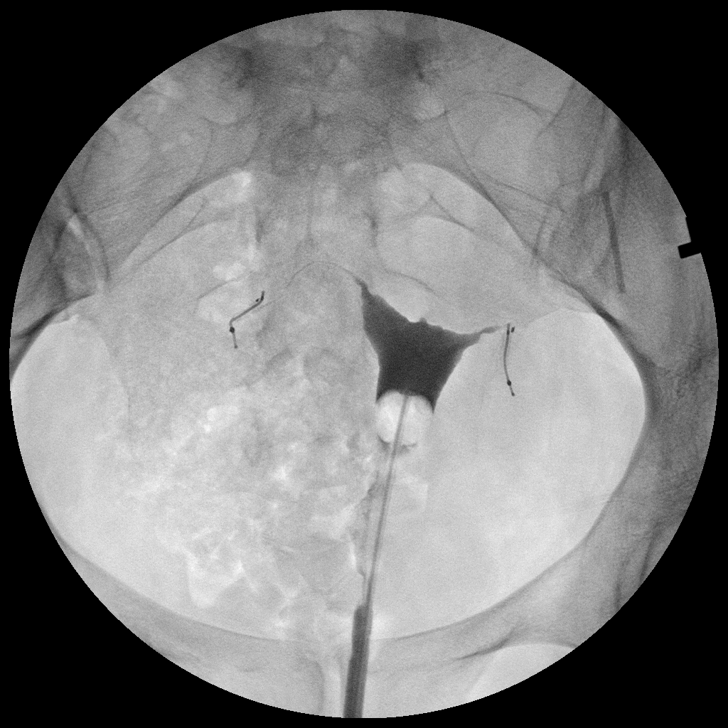

[Series 4: run · 1 of 1 slices shown (4 of 8)]
[im 1/1]
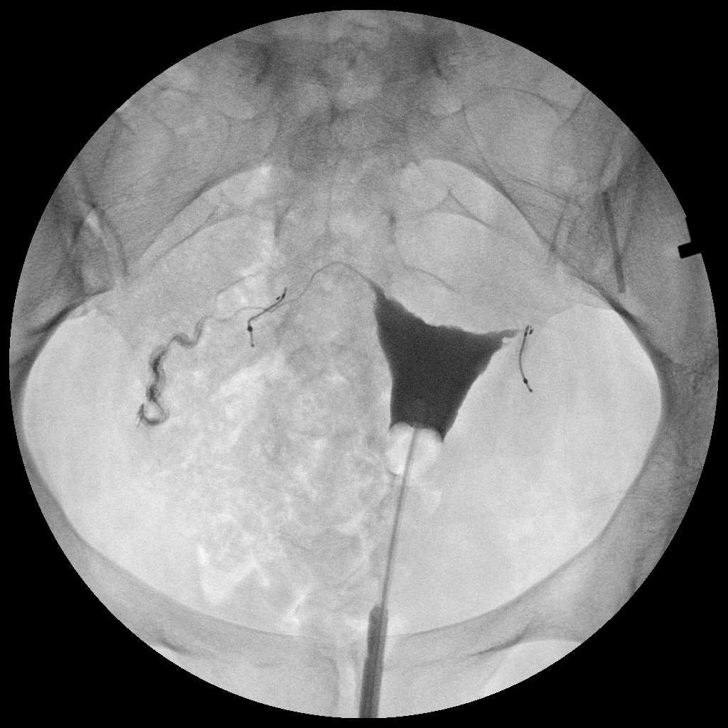

[Series 5: run · 1 of 1 slices shown (5 of 8)]
[im 1/1]
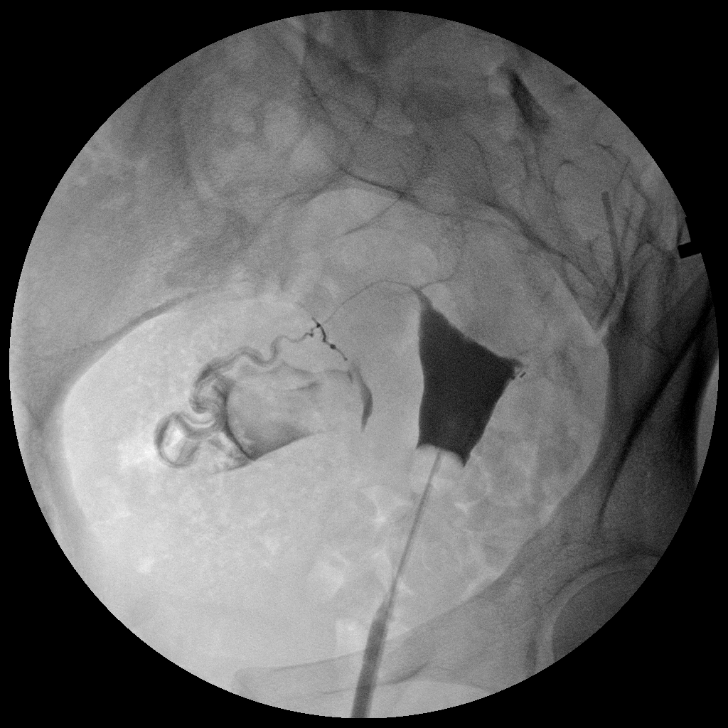

[Series 6: run · 1 of 1 slices shown (6 of 8)]
[im 1/1]
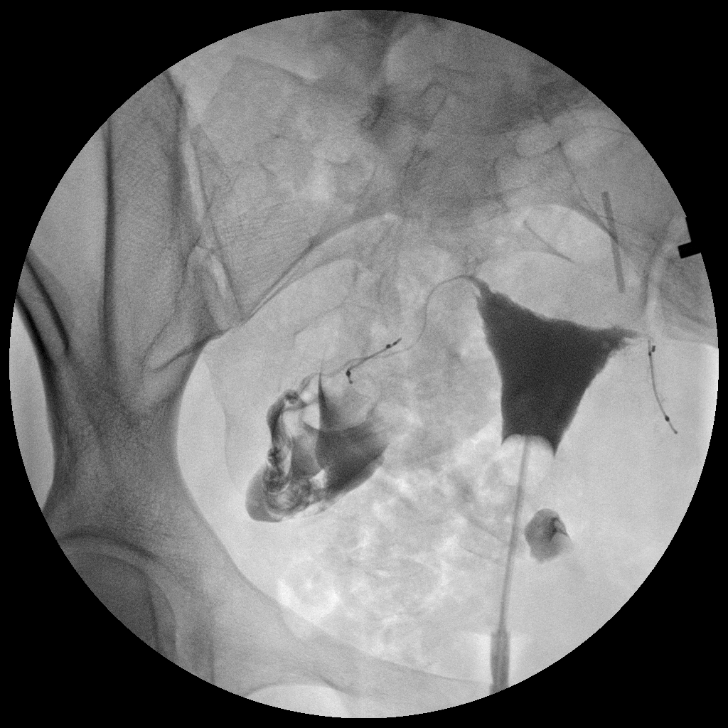

[Series 7: run · 1 of 1 slices shown (7 of 8)]
[im 1/1]
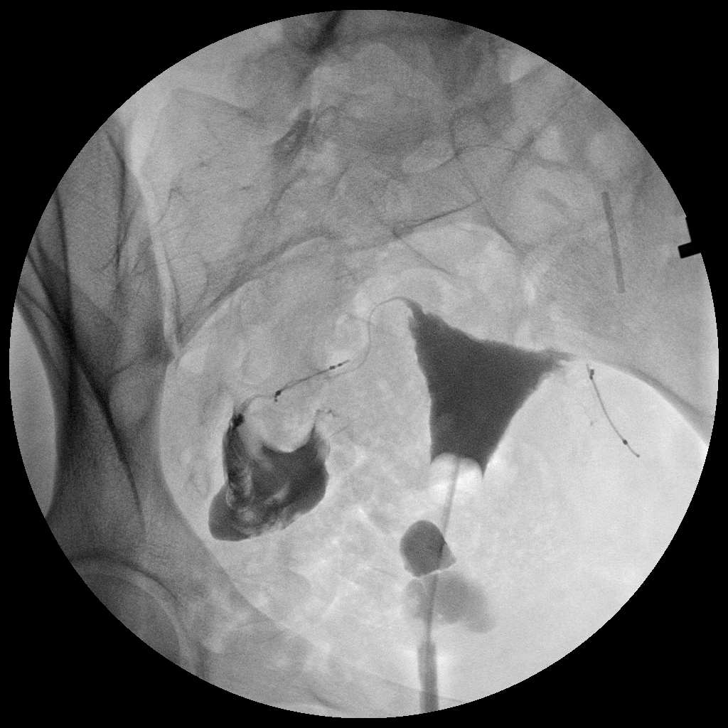

[Series 8: run · 1 of 1 slices shown (8 of 8)]
[im 1/1]
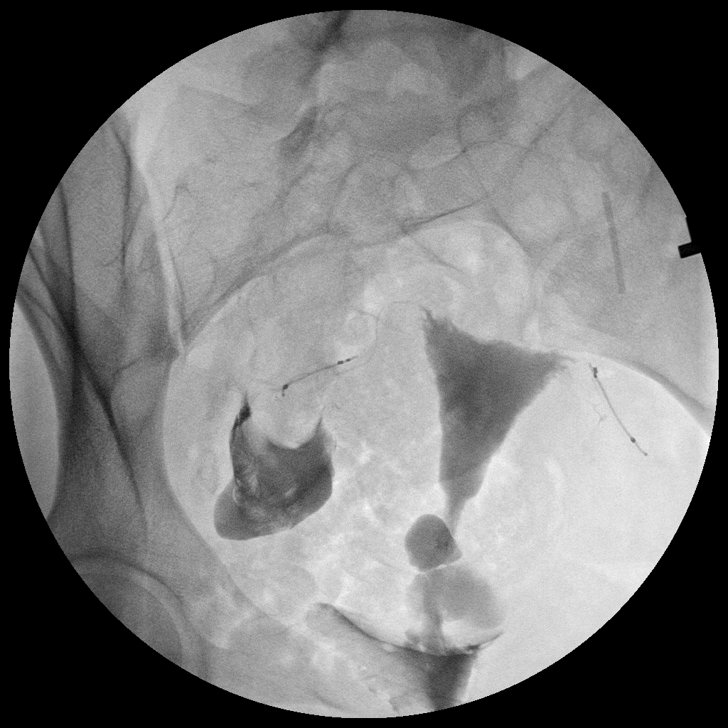

[8 of 8 positions shown; findings below may reference images not displayed]

FLUOROSCOPY TIME:  Fluoroscopy Time:  1 minutes 12 second

Number of Acquired Images:  7
FINDINGS: Endometrial contour is within normal limits. The right Essure
implant is abnormally located outside the fallopian tube, projecting
over the right hemipelvis. The right fallopian tube is normal in
course and caliber. Right-sided free intraperitoneal spill is
identified.

The left Essure implant is appropriately located with respect to the
utero tubal junction. No left-sided tubal opacification or
left-sided free intraperitoneal spill is identified. Cervical canal
is within normal limits. Opacification of probable nabothian cysts
or diverticula are incidentally noted at the lower uterine segment/
cervix.
IMPRESSION: Abnormal right Essure implant location outside the right fallopian
tube, with right-sided tubal opacification and right-sided free
intraperitoneal spill identified. The patient is advised to contact
her physician regarding alternative methods of birth control.

Appropriate position and appearance of left Essure implants with
apparent left tubal occlusion at the utero tubal junction.

I discussed these findings with the patient at the time of imaging
today.

These results will be called to the ordering clinician or
representative by the Radiologist Assistant, and communication
documented in the PACS or zVision Dashboard.

## 2017-05-07 DIAGNOSIS — Z0000 Encounter for general adult medical examination without abnormal findings: Secondary | ICD-10-CM | POA: Diagnosis not present

## 2017-05-13 DIAGNOSIS — Z0000 Encounter for general adult medical examination without abnormal findings: Secondary | ICD-10-CM | POA: Diagnosis not present

## 2017-08-13 DIAGNOSIS — L659 Nonscarring hair loss, unspecified: Secondary | ICD-10-CM | POA: Diagnosis not present

## 2017-09-11 DIAGNOSIS — D485 Neoplasm of uncertain behavior of skin: Secondary | ICD-10-CM | POA: Diagnosis not present

## 2017-09-11 DIAGNOSIS — L2089 Other atopic dermatitis: Secondary | ICD-10-CM | POA: Diagnosis not present

## 2017-09-11 DIAGNOSIS — D2239 Melanocytic nevi of other parts of face: Secondary | ICD-10-CM | POA: Diagnosis not present

## 2017-09-11 DIAGNOSIS — L700 Acne vulgaris: Secondary | ICD-10-CM | POA: Diagnosis not present

## 2017-09-11 DIAGNOSIS — D225 Melanocytic nevi of trunk: Secondary | ICD-10-CM | POA: Diagnosis not present

## 2017-12-18 DIAGNOSIS — Z6824 Body mass index (BMI) 24.0-24.9, adult: Secondary | ICD-10-CM | POA: Diagnosis not present

## 2017-12-18 DIAGNOSIS — Z01419 Encounter for gynecological examination (general) (routine) without abnormal findings: Secondary | ICD-10-CM | POA: Diagnosis not present

## 2018-05-14 DIAGNOSIS — Z0000 Encounter for general adult medical examination without abnormal findings: Secondary | ICD-10-CM | POA: Diagnosis not present

## 2018-11-11 DIAGNOSIS — Z1329 Encounter for screening for other suspected endocrine disorder: Secondary | ICD-10-CM | POA: Diagnosis not present

## 2018-11-11 DIAGNOSIS — R1032 Left lower quadrant pain: Secondary | ICD-10-CM | POA: Diagnosis not present

## 2018-11-11 DIAGNOSIS — N938 Other specified abnormal uterine and vaginal bleeding: Secondary | ICD-10-CM | POA: Diagnosis not present

## 2019-02-10 DIAGNOSIS — Z01419 Encounter for gynecological examination (general) (routine) without abnormal findings: Secondary | ICD-10-CM | POA: Diagnosis not present

## 2019-02-10 DIAGNOSIS — F419 Anxiety disorder, unspecified: Secondary | ICD-10-CM | POA: Diagnosis not present

## 2019-02-10 DIAGNOSIS — Z6825 Body mass index (BMI) 25.0-25.9, adult: Secondary | ICD-10-CM | POA: Diagnosis not present

## 2019-03-20 ENCOUNTER — Other Ambulatory Visit: Payer: Self-pay

## 2019-03-20 ENCOUNTER — Ambulatory Visit: Payer: BLUE CROSS/BLUE SHIELD | Attending: Internal Medicine

## 2019-03-20 DIAGNOSIS — Z23 Encounter for immunization: Secondary | ICD-10-CM

## 2019-03-20 NOTE — Progress Notes (Signed)
   Covid-19 Vaccination Clinic  Name:  Nancy Sullivan    MRN: [id number] DOB: 1985-11-28  03/20/2019  [name] was observed post Covid-19 immunization for 15 minutes without incident. She was provided with Vaccine Information Sheet and instruction to access the V-Safe system.   [name] was instructed to call 911 with any severe reactions post vaccine: Marland Kitchen Difficulty breathing  . Swelling of face and throat  . A fast heartbeat  . A bad rash all over body  . Dizziness and weakness   Immunizations Administered    Name Date Dose VIS Date Route   Pfizer COVID-19 Vaccine 03/20/2019 11:29 AM 0.3 mL 12/12/2018 Intramuscular   Manufacturer: [place]   Lot: [id number]   [place]: [id number]

## 2019-04-14 ENCOUNTER — Ambulatory Visit: Payer: BLUE CROSS/BLUE SHIELD | Attending: Internal Medicine

## 2019-04-14 DIAGNOSIS — Z23 Encounter for immunization: Secondary | ICD-10-CM

## 2019-04-14 NOTE — Progress Notes (Signed)
   Covid-19 Vaccination Clinic  Name:  Nancy Sullivan    MRN: [id number] DOB: 1985/04/08  04/14/2019  Ms. Nancy Sullivan was observed post Covid-19 immunization for 15 minutes without incident. She was provided with Vaccine Information Sheet and instruction to access the V-Safe system.   Ms. Nancy Sullivan was instructed to call 911 with any severe reactions post vaccine: Marland Kitchen Difficulty breathing  . Swelling of face and throat  . A fast heartbeat  . A bad rash all over body  . Dizziness and weakness   Immunizations Administered    Name Date Dose VIS Date Route   Pfizer COVID-19 Vaccine 04/14/2019 12:24 PM 0.3 mL 12/12/2018 Intramuscular   Manufacturer: [place]   Lot: [id number]   [place]: [id number]

## 2019-05-27 DIAGNOSIS — Z8639 Personal history of other endocrine, nutritional and metabolic disease: Secondary | ICD-10-CM | POA: Diagnosis not present

## 2019-05-27 DIAGNOSIS — Z13220 Encounter for screening for lipoid disorders: Secondary | ICD-10-CM | POA: Diagnosis not present

## 2019-05-27 DIAGNOSIS — Z0000 Encounter for general adult medical examination without abnormal findings: Secondary | ICD-10-CM | POA: Diagnosis not present

## 2019-06-03 DIAGNOSIS — Z0000 Encounter for general adult medical examination without abnormal findings: Secondary | ICD-10-CM | POA: Diagnosis not present

## 2019-06-26 DIAGNOSIS — L659 Nonscarring hair loss, unspecified: Secondary | ICD-10-CM | POA: Diagnosis not present

## 2019-11-10 ENCOUNTER — Ambulatory Visit: Payer: BLUE CROSS/BLUE SHIELD | Admitting: Podiatry

## 2019-11-10 ENCOUNTER — Other Ambulatory Visit: Payer: Self-pay

## 2019-11-10 ENCOUNTER — Ambulatory Visit (INDEPENDENT_AMBULATORY_CARE_PROVIDER_SITE_OTHER): Payer: BC Managed Care – PPO

## 2019-11-10 VITALS — BP 129/70 | HR 91 | Temp 98.0°F

## 2019-11-10 DIAGNOSIS — M21611 Bunion of right foot: Secondary | ICD-10-CM | POA: Diagnosis not present

## 2019-11-10 DIAGNOSIS — M21619 Bunion of unspecified foot: Secondary | ICD-10-CM | POA: Diagnosis not present

## 2019-11-10 DIAGNOSIS — M79671 Pain in right foot: Secondary | ICD-10-CM | POA: Diagnosis not present

## 2019-11-10 NOTE — Patient Instructions (Addendum)
If was nice to meet you today. If you have any questions or any further concerns, please feel fee to give me a call. You can call our office at [phone] or please feel fee to send me a message through [place].    You can try Voltaren gel on the bunion as well if it becomes sore.   Bunion  A bunion is a bump on the base of the big toe that forms when the bones of the big toe joint move out of position. Bunions may be small at first, but they often get larger over time. They can make walking painful. What are the causes? A bunion may be caused by:  Wearing narrow or pointed shoes that force the big toe to press against the other toes.  Abnormal foot development that causes the foot to roll inward (pronate).  Changes in the foot that are caused by certain diseases, such as rheumatoid arthritis or polio.  A foot injury. What increases the risk? The following factors may make you more likely to develop this condition:  Wearing shoes that squeeze the toes together.  Having certain diseases, such as: ? Rheumatoid arthritis. ? Polio. ? Cerebral palsy.  Having family members who have bunions.  Being born with a foot deformity, such as flat feet or low arches.  Doing activities that put a lot of pressure on the feet, such as ballet dancing. What are the signs or symptoms? The main symptom of a bunion is a noticeable bump on the big toe. Other symptoms may include:  Pain.  Swelling around the big toe.  Redness and inflammation.  Thick or hardened skin on the big toe or between the toes.  Stiffness or loss of motion in the big toe.  Trouble with walking. How is this diagnosed? A bunion may be diagnosed based on your symptoms, medical history, and activities. You may have tests, such as:  X-rays. These allow your health care provider to check the position of the bones in your foot and look for damage to your joint. They also help your health care provider determine the  severity of your bunion and the best way to treat it.  Joint aspiration. In this test, a sample of fluid is removed from the toe joint. This test may be done if you are in a lot of pain. It helps rule out diseases that cause painful swelling of the joints, such as arthritis. How is this treated? Treatment depends on the severity of your symptoms. The goal of treatment is to relieve symptoms and prevent the bunion from getting worse. Your health care provider may recommend:  Wearing shoes that have a wide toe box.  Using bunion pads to cushion the affected area.  Taping your toes together to keep them in a normal position.  Placing a device inside your shoe (orthotics) to help reduce pressure on your toe joint.  Taking medicine to ease pain, inflammation, and swelling.  Applying heat or ice to the affected area.  Doing stretching exercises.  Surgery to remove scar tissue and move the toes back into their normal position. This treatment is rare. Follow these instructions at home: Managing pain, stiffness, and swelling   If directed, put ice on the painful area: ? Put ice in a plastic bag. ? Place a towel between your skin and the bag. ? Leave the ice on for 20 minutes, 2-3 times a day. Activity   If directed, apply heat to the affected area before you  exercise. Use the heat source that your health care provider recommends, such as a moist heat pack or a heating pad. ? Place a towel between your skin and the heat source. ? Leave the heat on for 20-30 minutes. ? Remove the heat if your skin turns bright red. This is especially important if you are unable to feel pain, heat, or cold. You may have a greater risk of getting burned.  Do exercises as told by your health care provider. General instructions  Support your toe joint with proper footwear, shoe padding, or taping as told by your health care provider.  Take over-the-counter and prescription medicines only as told by your  health care provider.  Keep all follow-up visits as told by your health care provider. This is important. Contact a health care provider if your symptoms:  Get worse.  Do not improve in 2 weeks. Get help right away if you have:  Severe pain and trouble with walking. Summary  A bunion is a bump on the base of the big toe that forms when the bones of the big toe joint move out of position.  Bunions can make walking painful.  Treatment depends on the severity of your symptoms.  Support your toe joint with proper footwear, shoe padding, or taping as told by your health care provider. This information is not intended to replace advice given to you by your health care provider. Make sure you discuss any questions you have with your health care provider. Document Revised: 06/24/2017 Document Reviewed: 04/30/2017 Elsevier Patient Education  [place].

## 2019-11-17 NOTE — Progress Notes (Signed)
Subjective:   Patient ID: Nancy Sullivan, female   DOB: 34 y.o.   MRN: [id number]   HPI 34 year old female presents to the office today for concerns of a painful bunion on her right foot.  She still has a sharp, throbbing discomfort to the area and hurts with pressure and walking.  She denies any recent injury or trauma.  She said no recent treatment.  She has no other concerns.  Review of Systems  All other systems reviewed and are negative.  Past Medical History:  Diagnosis Date  . Seasonal allergies   . SVD (spontaneous vaginal delivery)    X 2    Past Surgical History:  Procedure Laterality Date  . DILITATION & CURRETTAGE/HYSTROSCOPY WITH ESSURE Bilateral 01/15/2014   Procedure: DILATATION & CURETTAGE/HYSTEROSCOPY WITH ESSURE;  Surgeon: Lovenia Kim, MD;  Location: [facility];  Service: Gynecology;  Laterality: Bilateral;  1 hr.  Marland Kitchen LAPAROSCOPIC TUBAL LIGATION Bilateral 05/07/2014   Procedure: LAPAROSCOPIC TUBAL LIGATION AND RIGHT FALLOPIAN TUBAL ESSURE IMPLANT REMOVAL;  Surgeon: Brien Few, MD;  Location: [facility];  Service: Gynecology;  Laterality: Bilateral;  . SVD      x 2  . WISDOM TOOTH EXTRACTION       Current Outpatient Medications:  .  cetirizine (ZYRTEC) 10 MG tablet, Take 10 mg by mouth daily., Disp: , Rfl:  .  HYDROcodone-ibuprofen (VICOPROFEN) 7.5-200 MG per tablet, Take 1 tablet by mouth every 8 (eight) hours as needed for moderate pain., Disp: 30 tablet, Rfl: 0 .  ibuprofen (ADVIL,MOTRIN) 600 MG tablet, Take 1 tablet (600 mg total) by mouth every 6 (six) hours. (Patient not taking: Reported on 01/08/2014), Disp: 30 tablet, Rfl: 0 .  Multiple Vitamin (MULTIVITAMIN WITH MINERALS) TABS tablet, Take 1 tablet by mouth daily., Disp: , Rfl:   No Known Allergies       Objective:  Physical Exam  General: AAO x3, NAD  Dermatological: Skin is warm, dry and supple bilateral.There are no open sores, no preulcerative lesions, no rash or signs of infection  present.  Vascular: Dorsalis Pedis artery and Posterior Tibial artery pedal pulses are 2/4 bilateral with immedate capillary fill time.   Neruologic: Grossly intact via light touch bilateral.  Musculoskeletal: There is moderate to severe bunion present on the right foot with tenderness along the medial aspect the first metatarsal head subjectively.  There is hypermobility present the first ray.  There is no pain or crepitation with first IPJ range of motion.  Is no other areas of discomfort identified at this time.  Muscular strength 5/5 in all groups tested bilateral.  Gait: Unassisted, Nonantalgic.       Assessment:   34 year old female with right foot bunion     Plan:  -Treatment options discussed including all alternatives, risks, and complications -Etiology of symptoms were discussed -X-rays were obtained and reviewed with the patient.  Significant bunion is present on the right foot.  There is no evidence of acute fracture. -We discussed both conservative as well surgical treatment options.  Conservative discussed shoe modifications, inserts, Voltaren gel offloading pads as well.  Surgically we discussed first metatarsal osteotomy versus Lapidus bunionectomy.  Likely would benefit more from a Lapidus bunionectomy given her hypermobility as well as IM angle.  Trula Slade DPM

## 2020-03-23 DIAGNOSIS — L700 Acne vulgaris: Secondary | ICD-10-CM | POA: Diagnosis not present

## 2020-03-23 DIAGNOSIS — K130 Diseases of lips: Secondary | ICD-10-CM | POA: Diagnosis not present

## 2020-06-01 DIAGNOSIS — Z8639 Personal history of other endocrine, nutritional and metabolic disease: Secondary | ICD-10-CM | POA: Diagnosis not present

## 2020-06-01 DIAGNOSIS — Z0000 Encounter for general adult medical examination without abnormal findings: Secondary | ICD-10-CM | POA: Diagnosis not present

## 2020-06-17 DIAGNOSIS — Z0000 Encounter for general adult medical examination without abnormal findings: Secondary | ICD-10-CM | POA: Diagnosis not present

## 2020-11-09 DIAGNOSIS — L239 Allergic contact dermatitis, unspecified cause: Secondary | ICD-10-CM | POA: Diagnosis not present

## 2020-12-15 DIAGNOSIS — J320 Chronic maxillary sinusitis: Secondary | ICD-10-CM | POA: Diagnosis not present

## 2021-02-15 DIAGNOSIS — Z302 Encounter for sterilization: Secondary | ICD-10-CM | POA: Diagnosis not present

## 2021-02-15 DIAGNOSIS — Z6827 Body mass index (BMI) 27.0-27.9, adult: Secondary | ICD-10-CM | POA: Diagnosis not present

## 2021-02-15 DIAGNOSIS — Z01419 Encounter for gynecological examination (general) (routine) without abnormal findings: Secondary | ICD-10-CM | POA: Diagnosis not present

## 2021-02-15 DIAGNOSIS — Z01411 Encounter for gynecological examination (general) (routine) with abnormal findings: Secondary | ICD-10-CM | POA: Diagnosis not present

## 2021-02-15 DIAGNOSIS — R635 Abnormal weight gain: Secondary | ICD-10-CM | POA: Diagnosis not present

## 2021-02-15 DIAGNOSIS — F419 Anxiety disorder, unspecified: Secondary | ICD-10-CM | POA: Diagnosis not present

## 2021-02-15 DIAGNOSIS — Z124 Encounter for screening for malignant neoplasm of cervix: Secondary | ICD-10-CM | POA: Diagnosis not present

## 2021-06-15 DIAGNOSIS — Z0000 Encounter for general adult medical examination without abnormal findings: Secondary | ICD-10-CM | POA: Diagnosis not present

## 2021-06-22 DIAGNOSIS — Z0000 Encounter for general adult medical examination without abnormal findings: Secondary | ICD-10-CM | POA: Diagnosis not present

## 2021-09-20 DIAGNOSIS — Z01818 Encounter for other preprocedural examination: Secondary | ICD-10-CM | POA: Diagnosis not present

## 2021-10-17 DIAGNOSIS — N62 Hypertrophy of breast: Secondary | ICD-10-CM | POA: Diagnosis not present

## 2022-03-26 DIAGNOSIS — Z8639 Personal history of other endocrine, nutritional and metabolic disease: Secondary | ICD-10-CM | POA: Diagnosis not present

## 2022-03-26 DIAGNOSIS — L659 Nonscarring hair loss, unspecified: Secondary | ICD-10-CM | POA: Diagnosis not present

## 2022-04-10 DIAGNOSIS — D72819 Decreased white blood cell count, unspecified: Secondary | ICD-10-CM | POA: Diagnosis not present

## 2022-04-10 DIAGNOSIS — L659 Nonscarring hair loss, unspecified: Secondary | ICD-10-CM | POA: Diagnosis not present

## 2022-04-10 DIAGNOSIS — R768 Other specified abnormal immunological findings in serum: Secondary | ICD-10-CM | POA: Diagnosis not present

## 2022-04-10 DIAGNOSIS — D509 Iron deficiency anemia, unspecified: Secondary | ICD-10-CM | POA: Diagnosis not present

## 2022-04-17 DIAGNOSIS — Z01419 Encounter for gynecological examination (general) (routine) without abnormal findings: Secondary | ICD-10-CM | POA: Diagnosis not present

## 2022-04-17 DIAGNOSIS — R87610 Atypical squamous cells of undetermined significance on cytologic smear of cervix (ASC-US): Secondary | ICD-10-CM | POA: Diagnosis not present

## 2022-07-02 DIAGNOSIS — Z0000 Encounter for general adult medical examination without abnormal findings: Secondary | ICD-10-CM | POA: Diagnosis not present

## 2022-07-09 DIAGNOSIS — F419 Anxiety disorder, unspecified: Secondary | ICD-10-CM | POA: Diagnosis not present

## 2022-07-09 DIAGNOSIS — Z0000 Encounter for general adult medical examination without abnormal findings: Secondary | ICD-10-CM | POA: Diagnosis not present

## 2022-07-13 DIAGNOSIS — H53143 Visual discomfort, bilateral: Secondary | ICD-10-CM | POA: Diagnosis not present

## 2022-10-04 DIAGNOSIS — F419 Anxiety disorder, unspecified: Secondary | ICD-10-CM | POA: Diagnosis not present

## 2022-10-24 DIAGNOSIS — F419 Anxiety disorder, unspecified: Secondary | ICD-10-CM | POA: Diagnosis not present

## 2023-04-22 DIAGNOSIS — Z01419 Encounter for gynecological examination (general) (routine) without abnormal findings: Secondary | ICD-10-CM | POA: Diagnosis not present

## 2023-04-22 DIAGNOSIS — Z1331 Encounter for screening for depression: Secondary | ICD-10-CM | POA: Diagnosis not present

## 2023-04-22 DIAGNOSIS — Z124 Encounter for screening for malignant neoplasm of cervix: Secondary | ICD-10-CM | POA: Diagnosis not present

## 2023-06-05 DIAGNOSIS — E663 Overweight: Secondary | ICD-10-CM | POA: Diagnosis not present

## 2023-07-01 DIAGNOSIS — Z0000 Encounter for general adult medical examination without abnormal findings: Secondary | ICD-10-CM | POA: Diagnosis not present

## 2023-07-12 DIAGNOSIS — Z0000 Encounter for general adult medical examination without abnormal findings: Secondary | ICD-10-CM | POA: Diagnosis not present

## 2023-10-02 DIAGNOSIS — E663 Overweight: Secondary | ICD-10-CM | POA: Diagnosis not present

## 2023-10-02 DIAGNOSIS — R220 Localized swelling, mass and lump, head: Secondary | ICD-10-CM | POA: Diagnosis not present

## 2023-10-02 DIAGNOSIS — L0201 Cutaneous abscess of face: Secondary | ICD-10-CM | POA: Diagnosis not present
